# Patient Record
Sex: Female | Born: 1940 | Race: White | Hispanic: No | Marital: Married | Smoking: Never smoker
Health system: Southern US, Community
[De-identification: ages and names within clinical notes are randomized; demographics above are authoritative.]

## PROBLEM LIST (undated history)

## (undated) DIAGNOSIS — I2510 Atherosclerotic heart disease of native coronary artery without angina pectoris: Secondary | ICD-10-CM

## (undated) DIAGNOSIS — I214 Non-ST elevation (NSTEMI) myocardial infarction: Secondary | ICD-10-CM

## (undated) HISTORY — PX: COLONOSCOPY: SHX174

## (undated) HISTORY — PX: BYPASS GRAFT: SHX909

## (undated) HISTORY — PX: CARDIAC SURGERY: SHX584

## (undated) HISTORY — PX: ABDOMINAL SURGERY: SHX537

---
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
## 2018-12-18 ENCOUNTER — Other Ambulatory Visit: Payer: Self-pay

## 2018-12-18 ENCOUNTER — Encounter (HOSPITAL_COMMUNITY): Payer: Self-pay | Admitting: Emergency Medicine

## 2018-12-18 ENCOUNTER — Emergency Department (HOSPITAL_COMMUNITY): Payer: Medicare Other

## 2018-12-18 ENCOUNTER — Inpatient Hospital Stay (HOSPITAL_COMMUNITY)
Admission: EM | Admit: 2018-12-18 | Discharge: 2018-12-30 | DRG: 981 | Disposition: A | Discharge status: Skilled Nursing Facility | Payer: Medicare Other | Attending: Internal Medicine | Admitting: Internal Medicine

## 2018-12-18 DIAGNOSIS — S32000A Wedge compression fracture of unspecified lumbar vertebra, initial encounter for closed fracture: Secondary | ICD-10-CM | POA: Diagnosis present

## 2018-12-18 DIAGNOSIS — S34101A Unspecified injury to L1 level of lumbar spinal cord, initial encounter: Secondary | ICD-10-CM | POA: Diagnosis not present

## 2018-12-18 DIAGNOSIS — I2510 Atherosclerotic heart disease of native coronary artery without angina pectoris: Secondary | ICD-10-CM | POA: Diagnosis present

## 2018-12-18 DIAGNOSIS — Z1159 Encounter for screening for other viral diseases: Secondary | ICD-10-CM

## 2018-12-18 DIAGNOSIS — I952 Hypotension due to drugs: Secondary | ICD-10-CM | POA: Diagnosis not present

## 2018-12-18 DIAGNOSIS — S32019A Unspecified fracture of first lumbar vertebra, initial encounter for closed fracture: Secondary | ICD-10-CM | POA: Diagnosis present

## 2018-12-18 DIAGNOSIS — M545 Low back pain: Secondary | ICD-10-CM | POA: Diagnosis not present

## 2018-12-18 DIAGNOSIS — I252 Old myocardial infarction: Secondary | ICD-10-CM | POA: Diagnosis not present

## 2018-12-18 DIAGNOSIS — D62 Acute posthemorrhagic anemia: Secondary | ICD-10-CM | POA: Diagnosis not present

## 2018-12-18 DIAGNOSIS — N390 Urinary tract infection, site not specified: Secondary | ICD-10-CM | POA: Diagnosis not present

## 2018-12-18 DIAGNOSIS — G92 Toxic encephalopathy: Secondary | ICD-10-CM | POA: Diagnosis present

## 2018-12-18 DIAGNOSIS — K921 Melena: Secondary | ICD-10-CM | POA: Diagnosis not present

## 2018-12-18 DIAGNOSIS — I10 Essential (primary) hypertension: Secondary | ICD-10-CM | POA: Diagnosis not present

## 2018-12-18 DIAGNOSIS — K449 Diaphragmatic hernia without obstruction or gangrene: Secondary | ICD-10-CM | POA: Diagnosis not present

## 2018-12-18 DIAGNOSIS — S32012A Unstable burst fracture of first lumbar vertebra, initial encounter for closed fracture: Secondary | ICD-10-CM | POA: Diagnosis present

## 2018-12-18 DIAGNOSIS — Y92828 Other wilderness area as the place of occurrence of the external cause: Secondary | ICD-10-CM

## 2018-12-18 DIAGNOSIS — Z419 Encounter for procedure for purposes other than remedying health state, unspecified: Secondary | ICD-10-CM

## 2018-12-18 DIAGNOSIS — Z951 Presence of aortocoronary bypass graft: Secondary | ICD-10-CM | POA: Diagnosis not present

## 2018-12-18 DIAGNOSIS — R112 Nausea with vomiting, unspecified: Secondary | ICD-10-CM

## 2018-12-18 DIAGNOSIS — K317 Polyp of stomach and duodenum: Secondary | ICD-10-CM | POA: Diagnosis not present

## 2018-12-18 DIAGNOSIS — IMO0001 Reserved for inherently not codable concepts without codable children: Secondary | ICD-10-CM

## 2018-12-18 DIAGNOSIS — S32010A Wedge compression fracture of first lumbar vertebra, initial encounter for closed fracture: Secondary | ICD-10-CM

## 2018-12-18 DIAGNOSIS — S24104A Unspecified injury at T11-T12 level of thoracic spinal cord, initial encounter: Secondary | ICD-10-CM | POA: Diagnosis present

## 2018-12-18 DIAGNOSIS — T40605A Adverse effect of unspecified narcotics, initial encounter: Secondary | ICD-10-CM | POA: Diagnosis not present

## 2018-12-18 DIAGNOSIS — D72829 Elevated white blood cell count, unspecified: Secondary | ICD-10-CM | POA: Diagnosis present

## 2018-12-18 HISTORY — DX: Non-ST elevation (NSTEMI) myocardial infarction: I21.4

## 2018-12-18 LAB — BASIC METABOLIC PANEL
Anion gap: 10 (ref 5–15)
BUN: 23 mg/dL (ref 8–23)
CO2: 25 mmol/L (ref 22–32)
Calcium: 9.1 mg/dL (ref 8.9–10.3)
Chloride: 106 mmol/L (ref 98–111)
Creatinine, Ser: 1.04 mg/dL — ABNORMAL HIGH (ref 0.44–1.00)
GFR calc Af Amer: 60 mL/min — ABNORMAL LOW (ref >60)
GFR calc non Af Amer: 51 mL/min — ABNORMAL LOW (ref >60)
Glucose, Bld: 139 mg/dL — ABNORMAL HIGH (ref 70–99)
Potassium: 3.5 mmol/L (ref 3.5–5.1)
Sodium: 141 mmol/L (ref 135–145)

## 2018-12-18 LAB — CBC WITH DIFFERENTIAL/PLATELET
Abs Immature Granulocytes: 0.15 10*3/uL — ABNORMAL HIGH (ref 0.00–0.07)
Basophils Absolute: 0 10*3/uL (ref 0.0–0.1)
Basophils Relative: 0 %
Eosinophils Absolute: 0.1 10*3/uL (ref 0.0–0.5)
Eosinophils Relative: 0 %
HCT: 39.5 % (ref 36.0–46.0)
Hemoglobin: 12.9 g/dL (ref 12.0–15.0)
Immature Granulocytes: 1 %
Lymphocytes Relative: 5 %
Lymphs Abs: 0.9 10*3/uL (ref 0.7–4.0)
MCH: 29.3 pg (ref 26.0–34.0)
MCHC: 32.7 g/dL (ref 30.0–36.0)
MCV: 89.6 fL (ref 80.0–100.0)
Monocytes Absolute: 0.6 10*3/uL (ref 0.1–1.0)
Monocytes Relative: 3 %
Neutro Abs: 16 10*3/uL — ABNORMAL HIGH (ref 1.7–7.7)
Neutrophils Relative %: 91 %
Platelets: 230 10*3/uL (ref 150–400)
RBC: 4.41 MIL/uL (ref 3.87–5.11)
RDW: 13 % (ref 11.5–15.5)
WBC: 17.8 10*3/uL — ABNORMAL HIGH (ref 4.0–10.5)
nRBC: 0 % (ref 0.0–0.2)

## 2018-12-18 LAB — SARS CORONAVIRUS 2 BY RT PCR (HOSPITAL ORDER, PERFORMED IN ~~LOC~~ HOSPITAL LAB): SARS Coronavirus 2: NEGATIVE

## 2018-12-18 MED ORDER — MORPHINE SULFATE (PF) 4 MG/ML IV SOLN
4.0000 mg | Freq: Once | INTRAVENOUS | Status: AC
  Administered 2018-12-18: 18:00:00 4 mg via INTRAVENOUS
  Filled 2018-12-18: qty 1

## 2018-12-18 MED ORDER — MORPHINE SULFATE (PF) 4 MG/ML IV SOLN
4.0000 mg | Freq: Once | INTRAVENOUS | Status: AC
  Administered 2018-12-18: 4 mg via INTRAVENOUS
  Filled 2018-12-18: qty 1

## 2018-12-18 NOTE — ED Notes (Addendum)
Patient back from CT scan.
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
## 2018-12-18 NOTE — ED Notes (Signed)
Pt reports numbness to bilateral feet

## 2018-12-18 NOTE — ED Notes (Signed)
Patient arrived from [facility] ED, continues with pain all over body.  Decreased sensation on the left than right.  Color, mobility and temperature normal.
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
## 2018-12-19 ENCOUNTER — Emergency Department (HOSPITAL_COMMUNITY): Payer: Medicare Other

## 2018-12-19 ENCOUNTER — Encounter (HOSPITAL_COMMUNITY): Payer: Self-pay | Admitting: Family Medicine

## 2018-12-19 DIAGNOSIS — I2510 Atherosclerotic heart disease of native coronary artery without angina pectoris: Secondary | ICD-10-CM | POA: Diagnosis not present

## 2018-12-19 DIAGNOSIS — S32019A Unspecified fracture of first lumbar vertebra, initial encounter for closed fracture: Secondary | ICD-10-CM | POA: Diagnosis present

## 2018-12-19 DIAGNOSIS — D72829 Elevated white blood cell count, unspecified: Secondary | ICD-10-CM | POA: Diagnosis not present

## 2018-12-19 DIAGNOSIS — S32000A Wedge compression fracture of unspecified lumbar vertebra, initial encounter for closed fracture: Secondary | ICD-10-CM

## 2018-12-19 DIAGNOSIS — I10 Essential (primary) hypertension: Secondary | ICD-10-CM | POA: Diagnosis not present

## 2018-12-19 HISTORY — DX: Essential (primary) hypertension: I10

## 2018-12-19 HISTORY — DX: Atherosclerotic heart disease of native coronary artery without angina pectoris: I25.10

## 2018-12-19 LAB — SURGICAL PCR SCREEN
MRSA, PCR: NEGATIVE
Staphylococcus aureus: NEGATIVE

## 2018-12-19 LAB — GLUCOSE, CAPILLARY: Glucose-Capillary: 100 mg/dL — ABNORMAL HIGH (ref 70–99)

## 2018-12-19 MED ORDER — ONDANSETRON HCL 4 MG/2ML IJ SOLN
4.0000 mg | Freq: Four times a day (QID) | INTRAMUSCULAR | Status: DC | PRN
  Administered 2018-12-20 – 2018-12-29 (×4): 4 mg via INTRAVENOUS
  Filled 2018-12-19 (×5): qty 2

## 2018-12-19 MED ORDER — METHOCARBAMOL 500 MG PO TABS
500.0000 mg | ORAL_TABLET | Freq: Three times a day (TID) | ORAL | Status: DC | PRN
  Administered 2018-12-19 – 2018-12-30 (×15): 500 mg via ORAL
  Filled 2018-12-19 (×17): qty 1

## 2018-12-19 MED ORDER — PANTOPRAZOLE SODIUM 40 MG PO TBEC
40.0000 mg | DELAYED_RELEASE_TABLET | Freq: Every day | ORAL | Status: DC
  Administered 2018-12-19 – 2018-12-23 (×4): 40 mg via ORAL
  Filled 2018-12-19 (×4): qty 1

## 2018-12-19 MED ORDER — ACETAMINOPHEN 650 MG RE SUPP: 650.0000 mg | Freq: Four times a day (QID) | RECTAL | Status: DC | PRN

## 2018-12-19 MED ORDER — ONDANSETRON HCL 4 MG/2ML IJ SOLN
4.0000 mg | Freq: Once | INTRAMUSCULAR | Status: AC
  Administered 2018-12-19: 4 mg via INTRAVENOUS

## 2018-12-19 MED ORDER — ONDANSETRON HCL 4 MG/2ML IJ SOLN: 4.0000 mg | Freq: Once | INTRAMUSCULAR | Status: DC

## 2018-12-19 MED ORDER — POLYETHYLENE GLYCOL 3350 17 G PO PACK: 17.0000 g | PACK | Freq: Every day | ORAL | Status: DC | PRN

## 2018-12-19 MED ORDER — HYDROCODONE-ACETAMINOPHEN 5-325 MG PO TABS
1.0000 | ORAL_TABLET | ORAL | Status: DC | PRN
  Administered 2018-12-19: 1 via ORAL
  Filled 2018-12-19: qty 1

## 2018-12-19 MED ORDER — MORPHINE SULFATE (PF) 4 MG/ML IV SOLN
4.0000 mg | INTRAVENOUS | Status: DC | PRN
  Administered 2018-12-19 – 2018-12-22 (×5): 4 mg via INTRAVENOUS
  Filled 2018-12-19 (×5): qty 1

## 2018-12-19 MED ORDER — ONDANSETRON HCL 4 MG/2ML IJ SOLN
INTRAMUSCULAR | Status: AC
  Filled 2018-12-19: qty 2

## 2018-12-19 MED ORDER — POTASSIUM CHLORIDE IN NACL 20-0.9 MEQ/L-% IV SOLN
INTRAVENOUS | Status: AC
  Administered 2018-12-19: 07:00:00 via INTRAVENOUS
  Filled 2018-12-19 (×3): qty 1000

## 2018-12-19 MED ORDER — ONDANSETRON HCL 4 MG PO TABS
4.0000 mg | ORAL_TABLET | Freq: Four times a day (QID) | ORAL | Status: DC | PRN
  Administered 2018-12-19 – 2018-12-30 (×2): 4 mg via ORAL
  Filled 2018-12-19 (×2): qty 1

## 2018-12-19 MED ORDER — OXYCODONE-ACETAMINOPHEN 5-325 MG PO TABS
2.0000 | ORAL_TABLET | Freq: Once | ORAL | Status: AC
  Administered 2018-12-19: 01:00:00 2 via ORAL
  Filled 2018-12-19: qty 2

## 2018-12-19 MED ORDER — METOPROLOL TARTRATE 25 MG PO TABS
25.0000 mg | ORAL_TABLET | Freq: Every day | ORAL | Status: DC
  Administered 2018-12-19 – 2018-12-30 (×10): 25 mg via ORAL
  Filled 2018-12-19 (×11): qty 1

## 2018-12-19 MED ORDER — METHOCARBAMOL 500 MG PO TABS
500.0000 mg | ORAL_TABLET | Freq: Once | ORAL | Status: AC
  Administered 2018-12-19: 500 mg via ORAL
  Filled 2018-12-19: qty 1

## 2018-12-19 MED ORDER — PRAVASTATIN SODIUM 40 MG PO TABS
40.0000 mg | ORAL_TABLET | Freq: Every day | ORAL | Status: DC
  Administered 2018-12-19 – 2018-12-29 (×11): 40 mg via ORAL
  Filled 2018-12-19 (×11): qty 1

## 2018-12-19 MED ORDER — OXYCODONE HCL 5 MG PO TABS
5.0000 mg | ORAL_TABLET | ORAL | Status: DC | PRN
  Administered 2018-12-19 – 2018-12-23 (×11): 5 mg via ORAL
  Filled 2018-12-19 (×12): qty 1

## 2018-12-19 MED ORDER — ACETAMINOPHEN 325 MG PO TABS: 650.0000 mg | ORAL_TABLET | Freq: Four times a day (QID) | ORAL | Status: DC | PRN

## 2018-12-19 MED ORDER — ACETAMINOPHEN 500 MG PO TABS
1000.0000 mg | ORAL_TABLET | Freq: Three times a day (TID) | ORAL | Status: AC
  Administered 2018-12-19 – 2018-12-22 (×9): 1000 mg via ORAL
  Filled 2018-12-19 (×9): qty 2

## 2018-12-19 NOTE — ED Notes (Signed)
Son is Patricia Malone, cell phone [phone].  He would like to speak with neurosurgeon that is going to see his mother.    Son has been updated on care and floor of admission.

## 2018-12-19 NOTE — Anesthesia Preprocedure Evaluation (Addendum)
Anesthesia Evaluation  Patient identified by MRN, date of birth, ID band Patient awake    Reviewed: Allergy & Precautions, NPO status , Patient's Chart, lab work & pertinent test results, reviewed documented beta blocker date and time   History of Anesthesia Complications Negative for: history of anesthetic complications  Airway Mallampati: II  TM Distance: >3 FB Neck ROM: Full    Dental  (+) Teeth Intact, Dental Advidsory Given   Pulmonary neg pulmonary ROS,    Pulmonary exam normal        Cardiovascular hypertension, Pt. on home beta blockers and Pt. on medications + CAD, + Past MI and + CABG  Normal cardiovascular exam     Neuro/Psych L1 compression fracture    GI/Hepatic Neg liver ROS, GERD  Medicated and Controlled,  Endo/Other  negative endocrine ROS  Renal/GU negative Renal ROS     Musculoskeletal negative musculoskeletal ROS (+)   Abdominal   Peds  Hematology negative hematology ROS (+)   Anesthesia Other Findings Day of surgery medications reviewed with the patient.  Reproductive/Obstetrics                          Anesthesia Physical Anesthesia Plan  ASA: III  Anesthesia Plan: General   Post-op Pain Management:    Induction: Intravenous  PONV Risk Score and Plan: 4 or greater and Treatment may vary due to age or medical condition, Ondansetron and Dexamethasone  Airway Management Planned: Oral ETT  Additional Equipment:   Intra-op Plan:   Post-operative Plan: Extubation in OR  Informed Consent: I have reviewed the patients History and Physical, chart, labs and discussed the procedure including the risks, benefits and alternatives for the proposed anesthesia with the patient or authorized representative who has indicated his/her understanding and acceptance.     Dental advisory given  Plan Discussed with: CRNA  Anesthesia Plan Comments:       Anesthesia  Quick Evaluation

## 2018-12-19 NOTE — ED Provider Notes (Signed)
12:14 AM Assumed care from Dr. Jacqulyn Bath, please see their note for full history, physical and decision making until this point. In brief this is a 78 y.o. year old female who presented to the ED tonight with Back Pain     Transfer from [facility] secondary to compression fracture in her back with associated paresthesias bilateral lower extremities.  Had already discussed with neurosurgery who recommends MRI to evaluate for any neurologic compromise.  We will continue to work on pain control here. On my evaluation pain seems to be relatively improved but she does have bilateral paresthesias to the lateral lower legs.  Motor is intact.  Decreased sensation in the same areas. MRI pending.   Mri as above. Discussed with Dr. Johnsie Cancel who stated no indication for emergent surgery but will help manage if admitted for pain control.   Patient still in severe pain, barely able to move in bed. From out of town. No support at home. Not able to get pain controlled, will d/w medicine.   Labs, studies and imaging reviewed by myself and considered in medical decision making if ordered. Imaging interpreted by radiology.  Labs Reviewed  BASIC METABOLIC PANEL - Abnormal; Notable for the following components:      Result Value   Glucose, Bld 139 (*)    Creatinine, Ser 1.04 (*)    GFR calc non Af Amer 51 (*)    GFR calc Af Amer 60 (*)    All other components within normal limits  CBC WITH DIFFERENTIAL/PLATELET - Abnormal; Notable for the following components:   WBC 17.8 (*)    Neutro Abs 16.0 (*)    Abs Immature Granulocytes 0.15 (*)    All other components within normal limits  SARS CORONAVIRUS 2 (HOSPITAL ORDER, PERFORMED IN [facility])    CT Thoracic Spine Wo Contrast  Final Result    CT Lumbar Spine Wo Contrast  Final Result    MR LUMBAR SPINE WO CONTRAST    (Results Pending)    No follow-ups on file.    Oluwatomisin Hustead, Barbara Cower, MD 12/21/18 [phone]

## 2018-12-19 NOTE — Plan of Care (Signed)

## 2018-12-19 NOTE — ED Notes (Signed)
Returned from MRI 
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
## 2018-12-19 NOTE — ED Notes (Signed)
Patient with muscle spasm in her back.  She states that she broke out into a cold sweat and then became nauseated.  MD notified.  New orders per MD.
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
## 2018-12-19 NOTE — ED Notes (Signed)
Patient transported to MRI 
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
## 2018-12-20 ENCOUNTER — Observation Stay (HOSPITAL_COMMUNITY): Payer: Medicare Other

## 2018-12-20 ENCOUNTER — Observation Stay (HOSPITAL_COMMUNITY): Payer: Medicare Other | Admitting: Anesthesiology

## 2018-12-20 ENCOUNTER — Encounter (HOSPITAL_COMMUNITY)
Admission: EM | Disposition: A | Discharge status: Skilled Nursing Facility | Payer: Self-pay | Source: Home / Self Care | Attending: Family Medicine

## 2018-12-20 DIAGNOSIS — D62 Acute posthemorrhagic anemia: Secondary | ICD-10-CM | POA: Diagnosis not present

## 2018-12-20 DIAGNOSIS — I252 Old myocardial infarction: Secondary | ICD-10-CM | POA: Diagnosis not present

## 2018-12-20 DIAGNOSIS — D72823 Leukemoid reaction: Secondary | ICD-10-CM | POA: Diagnosis not present

## 2018-12-20 DIAGNOSIS — G92 Toxic encephalopathy: Secondary | ICD-10-CM | POA: Diagnosis present

## 2018-12-20 DIAGNOSIS — Z1159 Encounter for screening for other viral diseases: Secondary | ICD-10-CM | POA: Diagnosis not present

## 2018-12-20 DIAGNOSIS — I2510 Atherosclerotic heart disease of native coronary artery without angina pectoris: Secondary | ICD-10-CM | POA: Diagnosis present

## 2018-12-20 DIAGNOSIS — N390 Urinary tract infection, site not specified: Secondary | ICD-10-CM | POA: Diagnosis present

## 2018-12-20 DIAGNOSIS — K921 Melena: Secondary | ICD-10-CM | POA: Diagnosis not present

## 2018-12-20 DIAGNOSIS — S32010D Wedge compression fracture of first lumbar vertebra, subsequent encounter for fracture with routine healing: Secondary | ICD-10-CM | POA: Diagnosis not present

## 2018-12-20 DIAGNOSIS — T40605A Adverse effect of unspecified narcotics, initial encounter: Secondary | ICD-10-CM | POA: Diagnosis not present

## 2018-12-20 DIAGNOSIS — K2901 Acute gastritis with bleeding: Secondary | ICD-10-CM | POA: Diagnosis not present

## 2018-12-20 DIAGNOSIS — I10 Essential (primary) hypertension: Secondary | ICD-10-CM | POA: Diagnosis present

## 2018-12-20 DIAGNOSIS — K449 Diaphragmatic hernia without obstruction or gangrene: Secondary | ICD-10-CM | POA: Diagnosis present

## 2018-12-20 DIAGNOSIS — S32000A Wedge compression fracture of unspecified lumbar vertebra, initial encounter for closed fracture: Secondary | ICD-10-CM | POA: Diagnosis not present

## 2018-12-20 DIAGNOSIS — S34101A Unspecified injury to L1 level of lumbar spinal cord, initial encounter: Secondary | ICD-10-CM | POA: Diagnosis present

## 2018-12-20 DIAGNOSIS — M545 Low back pain: Secondary | ICD-10-CM | POA: Diagnosis present

## 2018-12-20 DIAGNOSIS — S24104A Unspecified injury at T11-T12 level of thoracic spinal cord, initial encounter: Secondary | ICD-10-CM | POA: Diagnosis present

## 2018-12-20 DIAGNOSIS — Y92828 Other wilderness area as the place of occurrence of the external cause: Secondary | ICD-10-CM | POA: Diagnosis not present

## 2018-12-20 DIAGNOSIS — Z951 Presence of aortocoronary bypass graft: Secondary | ICD-10-CM | POA: Diagnosis not present

## 2018-12-20 DIAGNOSIS — I952 Hypotension due to drugs: Secondary | ICD-10-CM | POA: Diagnosis not present

## 2018-12-20 DIAGNOSIS — S32010A Wedge compression fracture of first lumbar vertebra, initial encounter for closed fracture: Secondary | ICD-10-CM | POA: Diagnosis not present

## 2018-12-20 DIAGNOSIS — S32012A Unstable burst fracture of first lumbar vertebra, initial encounter for closed fracture: Secondary | ICD-10-CM | POA: Diagnosis present

## 2018-12-20 DIAGNOSIS — K317 Polyp of stomach and duodenum: Secondary | ICD-10-CM | POA: Diagnosis present

## 2018-12-20 HISTORY — PX: LUMBAR PERCUTANEOUS PEDICLE SCREW 2 LEVEL: SHX5561

## 2018-12-20 LAB — CBC WITH DIFFERENTIAL/PLATELET
Abs Immature Granulocytes: 0.03 10*3/uL (ref 0.00–0.07)
Basophils Absolute: 0 10*3/uL (ref 0.0–0.1)
Basophils Relative: 0 %
Eosinophils Absolute: 0.3 10*3/uL (ref 0.0–0.5)
Eosinophils Relative: 2 %
HCT: 36.5 % (ref 36.0–46.0)
Hemoglobin: 11.8 g/dL — ABNORMAL LOW (ref 12.0–15.0)
Immature Granulocytes: 0 %
Lymphocytes Relative: 14 %
Lymphs Abs: 1.6 10*3/uL (ref 0.7–4.0)
MCH: 29.1 pg (ref 26.0–34.0)
MCHC: 32.3 g/dL (ref 30.0–36.0)
MCV: 89.9 fL (ref 80.0–100.0)
Monocytes Absolute: 0.7 10*3/uL (ref 0.1–1.0)
Monocytes Relative: 6 %
Neutro Abs: 9.1 10*3/uL — ABNORMAL HIGH (ref 1.7–7.7)
Neutrophils Relative %: 78 %
Platelets: 183 10*3/uL (ref 150–400)
RBC: 4.06 MIL/uL (ref 3.87–5.11)
RDW: 13 % (ref 11.5–15.5)
WBC: 11.7 10*3/uL — ABNORMAL HIGH (ref 4.0–10.5)
nRBC: 0 % (ref 0.0–0.2)

## 2018-12-20 LAB — BASIC METABOLIC PANEL
Anion gap: 6 (ref 5–15)
BUN: 16 mg/dL (ref 8–23)
CO2: 27 mmol/L (ref 22–32)
Calcium: 8.5 mg/dL — ABNORMAL LOW (ref 8.9–10.3)
Chloride: 107 mmol/L (ref 98–111)
Creatinine, Ser: 0.94 mg/dL (ref 0.44–1.00)
GFR calc Af Amer: >60 mL/min (ref >60)
GFR calc non Af Amer: 58 mL/min — ABNORMAL LOW (ref >60)
Glucose, Bld: 99 mg/dL (ref 70–99)
Potassium: 3.5 mmol/L (ref 3.5–5.1)
Sodium: 140 mmol/L (ref 135–145)

## 2018-12-20 LAB — MAGNESIUM: Magnesium: 1.7 mg/dL (ref 1.7–2.4)

## 2018-12-20 SURGERY — LUMBAR PERCUTANEOUS PEDICLE SCREW 2 LEVEL
Anesthesia: General

## 2018-12-20 MED ORDER — PROPOFOL 10 MG/ML IV BOLUS
INTRAVENOUS | Status: AC
  Filled 2018-12-20: qty 20

## 2018-12-20 MED ORDER — DEXAMETHASONE SODIUM PHOSPHATE 10 MG/ML IJ SOLN
INTRAMUSCULAR | Status: DC | PRN
  Administered 2018-12-20: 10 mg via INTRAVENOUS

## 2018-12-20 MED ORDER — SODIUM CHLORIDE 0.9 % IV SOLN
INTRAVENOUS | Status: DC | PRN
  Administered 2018-12-20: 07:00:00

## 2018-12-20 MED ORDER — SODIUM CHLORIDE 0.9 % IV SOLN
INTRAVENOUS | Status: DC | PRN
  Administered 2018-12-20: 50 ug/min via INTRAVENOUS

## 2018-12-20 MED ORDER — OXYCODONE HCL 5 MG PO TABS: 5.0000 mg | ORAL_TABLET | Freq: Once | ORAL | Status: DC | PRN

## 2018-12-20 MED ORDER — THROMBIN 5000 UNITS EX SOLR
OROMUCOSAL | Status: DC | PRN
  Administered 2018-12-20: 07:00:00 via TOPICAL

## 2018-12-20 MED ORDER — SUGAMMADEX SODIUM 200 MG/2ML IV SOLN
INTRAVENOUS | Status: DC | PRN
  Administered 2018-12-20: 200 mg via INTRAVENOUS

## 2018-12-20 MED ORDER — CEFAZOLIN SODIUM-DEXTROSE 2-3 GM-%(50ML) IV SOLR
INTRAVENOUS | Status: DC | PRN
  Administered 2018-12-20: 2 g via INTRAVENOUS

## 2018-12-20 MED ORDER — THROMBIN 5000 UNITS EX SOLR
CUTANEOUS | Status: AC
  Filled 2018-12-20: qty 5000

## 2018-12-20 MED ORDER — ARTIFICIAL TEARS OPHTHALMIC OINT
TOPICAL_OINTMENT | OPHTHALMIC | Status: DC | PRN
  Administered 2018-12-20: 1 via OPHTHALMIC

## 2018-12-20 MED ORDER — HYDROMORPHONE HCL 1 MG/ML IJ SOLN
INTRAMUSCULAR | Status: AC
  Filled 2018-12-20: qty 1

## 2018-12-20 MED ORDER — ROCURONIUM BROMIDE 50 MG/5ML IV SOSY
PREFILLED_SYRINGE | INTRAVENOUS | Status: DC | PRN
  Administered 2018-12-20: 50 mg via INTRAVENOUS
  Administered 2018-12-20 (×2): 10 mg via INTRAVENOUS

## 2018-12-20 MED ORDER — OXYCODONE HCL 5 MG/5ML PO SOLN: 5.0000 mg | Freq: Once | ORAL | Status: DC | PRN

## 2018-12-20 MED ORDER — ONDANSETRON HCL 4 MG/2ML IJ SOLN
INTRAMUSCULAR | Status: DC | PRN
  Administered 2018-12-20: 4 mg via INTRAVENOUS

## 2018-12-20 MED ORDER — ACETAMINOPHEN 10 MG/ML IV SOLN: 1000.0000 mg | Freq: Once | INTRAVENOUS | Status: DC | PRN

## 2018-12-20 MED ORDER — BUPIVACAINE HCL (PF) 0.5 % IJ SOLN
INTRAMUSCULAR | Status: DC | PRN
  Administered 2018-12-20: 10 mL

## 2018-12-20 MED ORDER — PROMETHAZINE HCL 25 MG/ML IJ SOLN: 6.2500 mg | INTRAMUSCULAR | Status: DC | PRN

## 2018-12-20 MED ORDER — HYDROMORPHONE HCL 1 MG/ML IJ SOLN
0.2500 mg | INTRAMUSCULAR | Status: DC | PRN
  Administered 2018-12-20 (×2): 0.5 mg via INTRAVENOUS

## 2018-12-20 MED ORDER — PROPOFOL 10 MG/ML IV BOLUS
INTRAVENOUS | Status: DC | PRN
  Administered 2018-12-20: 150 mg via INTRAVENOUS

## 2018-12-20 MED ORDER — 0.9 % SODIUM CHLORIDE (POUR BTL) OPTIME
TOPICAL | Status: DC | PRN
  Administered 2018-12-20: 1000 mL

## 2018-12-20 MED ORDER — BUPIVACAINE HCL (PF) 0.5 % IJ SOLN
INTRAMUSCULAR | Status: AC
  Filled 2018-12-20: qty 30

## 2018-12-20 MED ORDER — FENTANYL CITRATE (PF) 250 MCG/5ML IJ SOLN
INTRAMUSCULAR | Status: AC
  Filled 2018-12-20: qty 5

## 2018-12-20 MED ORDER — LIDOCAINE 2% (20 MG/ML) 5 ML SYRINGE
INTRAMUSCULAR | Status: DC | PRN
  Administered 2018-12-20: 80 mg via INTRAVENOUS

## 2018-12-20 MED ORDER — CEFAZOLIN SODIUM-DEXTROSE 2-4 GM/100ML-% IV SOLN
INTRAVENOUS | Status: AC
  Filled 2018-12-20: qty 100

## 2018-12-20 MED ORDER — LIDOCAINE-EPINEPHRINE 1 %-1:100000 IJ SOLN
INTRAMUSCULAR | Status: DC | PRN
  Administered 2018-12-20: 10 mL

## 2018-12-20 MED ORDER — LIDOCAINE-EPINEPHRINE 1 %-1:100000 IJ SOLN
INTRAMUSCULAR | Status: AC
  Filled 2018-12-20: qty 1

## 2018-12-20 MED ORDER — FENTANYL CITRATE (PF) 100 MCG/2ML IJ SOLN
INTRAMUSCULAR | Status: DC | PRN
  Administered 2018-12-20 (×3): 50 ug via INTRAVENOUS
  Administered 2018-12-20 (×2): 25 ug via INTRAVENOUS

## 2018-12-20 MED ORDER — LACTATED RINGERS IV SOLN
INTRAVENOUS | Status: DC | PRN
  Administered 2018-12-20 (×2): via INTRAVENOUS

## 2018-12-20 SURGICAL SUPPLY — 68 items
BAG DECANTER FOR FLEXI CONT (MISCELLANEOUS) ×3 IMPLANT
BASKET BONE COLLECTION (BASKET) ×3 IMPLANT
BENZOIN TINCTURE PRP APPL 2/3 (GAUZE/BANDAGES/DRESSINGS) IMPLANT
BLADE CLIPPER SURG (BLADE) IMPLANT
BLADE SURG 11 STRL SS (BLADE) ×3 IMPLANT
BUR MATCHSTICK NEURO 3.0 LAGG (BURR) ×3 IMPLANT
BUR PRECISION FLUTE 5.0 (BURR) ×3 IMPLANT
CANISTER SUCT 3000ML PPV (MISCELLANEOUS) ×3 IMPLANT
CLOSURE WOUND 1/2 X4 (GAUZE/BANDAGES/DRESSINGS)
CONT SPEC 4OZ CLIKSEAL STRL BL (MISCELLANEOUS) ×3 IMPLANT
COVER BACK TABLE 60X90IN (DRAPES) ×3 IMPLANT
COVER WAND RF STERILE (DRAPES) ×3 IMPLANT
DECANTER SPIKE VIAL GLASS SM (MISCELLANEOUS) ×3 IMPLANT
DERMABOND ADVANCED (GAUZE/BANDAGES/DRESSINGS) ×4
DERMABOND ADVANCED .7 DNX12 (GAUZE/BANDAGES/DRESSINGS) ×1 IMPLANT
DRAPE C-ARM 42X72 X-RAY (DRAPES) ×2 IMPLANT
DRAPE C-ARMOR (DRAPES) ×2 IMPLANT
DRAPE LAPAROTOMY 100X72X124 (DRAPES) ×3 IMPLANT
DRAPE SURG 17X23 STRL (DRAPES) ×3 IMPLANT
DURAPREP 26ML APPLICATOR (WOUND CARE) ×3 IMPLANT
ELECT REM PT RETURN 9FT ADLT (ELECTROSURGICAL) ×3
ELECTRODE REM PT RTRN 9FT ADLT (ELECTROSURGICAL) ×1 IMPLANT
GAUZE 4X4 16PLY RFD (DISPOSABLE) IMPLANT
GAUZE SPONGE 4X4 12PLY STRL (GAUZE/BANDAGES/DRESSINGS) IMPLANT
GLOVE BIO SURGEON STRL SZ7.5 (GLOVE) ×6 IMPLANT
GLOVE BIOGEL PI IND STRL 7.5 (GLOVE) ×2 IMPLANT
GLOVE BIOGEL PI INDICATOR 7.5 (GLOVE) ×4
GLOVE EXAM NITRILE LRG STRL (GLOVE) IMPLANT
GLOVE EXAM NITRILE XL STR (GLOVE) IMPLANT
GLOVE EXAM NITRILE XS STR PU (GLOVE) IMPLANT
GOWN STRL REUS W/ TWL LRG LVL3 (GOWN DISPOSABLE) ×4 IMPLANT
GOWN STRL REUS W/ TWL XL LVL3 (GOWN DISPOSABLE) IMPLANT
GOWN STRL REUS W/TWL 2XL LVL3 (GOWN DISPOSABLE) IMPLANT
GOWN STRL REUS W/TWL LRG LVL3 (GOWN DISPOSABLE) ×4
GOWN STRL REUS W/TWL XL LVL3 (GOWN DISPOSABLE) ×2
GUIDEWIRE 18IN BLUNT CD HORIZ (WIRE) ×12 IMPLANT
HEMOSTAT POWDER KIT SURGIFOAM (HEMOSTASIS) ×3 IMPLANT
KIT BASIN OR (CUSTOM PROCEDURE TRAY) ×3 IMPLANT
KIT POSITION SURG JACKSON T1 (MISCELLANEOUS) ×3 IMPLANT
KIT TURNOVER KIT B (KITS) ×3 IMPLANT
MILL MEDIUM DISP (BLADE) ×3 IMPLANT
NDL BEVEL TWO-PAK W/1PK (NEEDLE) IMPLANT
NDL HYPO 18GX1.5 BLUNT FILL (NEEDLE) IMPLANT
NDL SPNL 18GX3.5 QUINCKE PK (NEEDLE) IMPLANT
NEEDLE BEVEL TWO-PAK W/1PK (NEEDLE) ×3 IMPLANT
NEEDLE HYPO 18GX1.5 BLUNT FILL (NEEDLE) IMPLANT
NEEDLE HYPO 22GX1.5 SAFETY (NEEDLE) ×3 IMPLANT
NEEDLE SPNL 18GX3.5 QUINCKE PK (NEEDLE) IMPLANT
NS IRRIG 1000ML POUR BTL (IV SOLUTION) ×3 IMPLANT
PACK LAMINECTOMY NEURO (CUSTOM PROCEDURE TRAY) ×3 IMPLANT
PAD ARMBOARD 7.5X6 YLW CONV (MISCELLANEOUS) ×9 IMPLANT
ROD PERC TI STRT 5.5X80 (Rod) ×4 IMPLANT
SCREW 5.5X40MM SOLERA VOYAGER (Screw) ×8 IMPLANT
SCREW MAS VOYAGER 4.5X40 (Screw) ×4 IMPLANT
SCREW SET 5.5/6.0MM SOLERA (Screw) ×12 IMPLANT
SPONGE LAP 4X18 RFD (DISPOSABLE) IMPLANT
SPONGE SURGIFOAM ABS GEL 100 (HEMOSTASIS) IMPLANT
STRIP CLOSURE SKIN 1/2X4 (GAUZE/BANDAGES/DRESSINGS) IMPLANT
SUT MNCRL AB 3-0 PS2 18 (SUTURE) ×3 IMPLANT
SUT MNCRL AB 4-0 PS2 18 (SUTURE) ×4 IMPLANT
SUT VIC AB 0 CT1 18XCR BRD8 (SUTURE) ×1 IMPLANT
SUT VIC AB 0 CT1 8-18 (SUTURE) ×2
SUT VIC AB 2-0 CP2 18 (SUTURE) ×3 IMPLANT
SYR 3ML LL SCALE MARK (SYRINGE) IMPLANT
TOWEL GREEN STERILE (TOWEL DISPOSABLE) ×3 IMPLANT
TOWEL GREEN STERILE FF (TOWEL DISPOSABLE) ×3 IMPLANT
TRAY FOLEY MTR SLVR 16FR STAT (SET/KITS/TRAYS/PACK) ×3 IMPLANT
WATER STERILE IRR 1000ML POUR (IV SOLUTION) ×3 IMPLANT
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
## 2018-12-20 NOTE — Progress Notes (Addendum)
Neurosurgery Service Post-operative progress note  Assessment & Plan: 78 y.o. woman s/p perctuaneous T12-L2 pedicle screw placement. Seen in PACU, still recovering from anesthesia but MAEx4 with good strength and trying to get out of bed, pain appears significantly improved from preop.   -post-op, activity as tolerated, no restrictions, no brace needed -will have PT/OT evaluate to determine if pt needs rehab before being discharged -hold DVT chemoPPx for 24 hours -my partners will be covering this weekend. [place] be home before then. I discussed this with the patient and her family preop and put follow up / wound care instructions in her discharge instructions in Epic.   Patricia Malone  12/20/18 10:48 AM

## 2018-12-20 NOTE — Brief Op Note (Signed)
12/20/2018  10:47 AM  PATIENT:  Patricia Malone  78 y.o. female  PRE-OPERATIVE DIAGNOSIS:  Unstable L1 burst fracture  POST-OPERATIVE DIAGNOSIS:  Unstable L1 burst fracture  PROCEDURE:  Procedure(s): Thoracic twelve-Lumbar two PERCUTANEOUS PEDICLE SCREW PLACEMENT (N/A)  SURGEON:  Surgeon(s) and Role:    * Saksham Akkerman, Clovis Pu, MD - Primary  ASSISTANTS: none   ANESTHESIA:   general  EBL:  20 mL   BLOOD ADMINISTERED:none  DRAINS: none   LOCAL MEDICATIONS USED:  LIDOCAINE   SPECIMEN:  No Specimen  DISPOSITION OF SPECIMEN:  N/A  COUNTS:  YES  TOURNIQUET:  * No tourniquets in log *  DICTATION: .Note written in EPIC  PLAN OF CARE: Admit to inpatient   PATIENT DISPOSITION:  PACU - hemodynamically stable.   Delay start of Pharmacological VTE agent (>24hrs) due to surgical blood loss or risk of bleeding: yes

## 2018-12-20 NOTE — Anesthesia Postprocedure Evaluation (Signed)
Anesthesia Post Note  Patient: Patricia Malone  Procedure(s) Performed: Thoracic twelve-Lumbar two PERCUTANEOUS PEDICLE SCREW PLACEMENT (N/A )     Patient location during evaluation: PACU Anesthesia Type: General Level of consciousness: awake and alert Pain management: pain level controlled Vital Signs Assessment: post-procedure vital signs reviewed and stable Respiratory status: spontaneous breathing, nonlabored ventilation and respiratory function stable Cardiovascular status: blood pressure returned to baseline and stable Postop Assessment: no apparent nausea or vomiting Anesthetic complications: no    Last Vitals:  Vitals:   12/20/18 1141 12/20/18 1155  BP: (!) 139/54 121/85  Pulse: 73 67  Resp: 14 15  Temp: 36.5 C 36.6 C  SpO2: 98% 98%    Last Pain:  Vitals:   12/20/18 1141  TempSrc:   PainSc: 3                  Lowella Curb

## 2018-12-20 NOTE — Progress Notes (Signed)
Pt has both hearing aids in room confirmed with oncoming night nurse.

## 2018-12-20 NOTE — Progress Notes (Signed)
Contacted the ED and 5N looking for patients belongings.  Belongings did not come with patient to Short Stay before surgery.  Patient stated that her Cell phone was last seen in the ED.  Requested for 5N and ED to check for the patients belongings.  Patient has a bed on 5N after surgery.
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
## 2018-12-20 NOTE — Progress Notes (Signed)
Spoke with Jeannett Senior (patient's son) this morning to inform him of his mother transporting to the OR at [phone].  Her son's number is [phone].  Please contact him when patient returns from the OR.  Will have day shift staff to follow up.

## 2018-12-20 NOTE — Transfer of Care (Signed)
Immediate Anesthesia Transfer of Care Note  Patient: Patricia Malone  Procedure(s) Performed: Thoracic twelve-Lumbar two PERCUTANEOUS PEDICLE SCREW PLACEMENT (N/A )  Patient Location: PACU  Anesthesia Type:General  Level of Consciousness: sedated  Airway & Oxygen Therapy: Patient Spontanous Breathing and Patient connected to face mask oxygen  Post-op Assessment: Report given to RN, Post -op Vital signs reviewed and stable and Patient moving all extremities X 4  Post vital signs: Reviewed and stable  Last Vitals:  Vitals Value Taken Time  BP 167/67 12/20/2018 10:26 AM  Temp    Pulse 85 12/20/2018 10:27 AM  Resp 18 12/20/2018 10:27 AM  SpO2 100 % 12/20/2018 10:27 AM  Vitals shown include unvalidated device data.  Last Pain:  Vitals:   12/20/18 0515  TempSrc:   PainSc: 10-Worst pain ever      Patients Stated Pain Goal: 0 (12/20/18 0515)  Complications: No apparent anesthesia complications

## 2018-12-20 NOTE — Anesthesia Procedure Notes (Signed)
Procedure Name: Intubation Date/Time: 12/20/2018 7:42 AM Performed by: Neldon Newport, CRNA Pre-anesthesia Checklist: Timeout performed, Patient being monitored, Suction available, Emergency Drugs available and Patient identified Patient Re-evaluated:Patient Re-evaluated prior to induction Oxygen Delivery Method: Circle system utilized Preoxygenation: Pre-oxygenation with 100% oxygen Induction Type: IV induction Ventilation: Mask ventilation without difficulty Laryngoscope Size: Mac and 3 Grade View: Grade II Tube type: Oral Tube size: 7.0 mm Number of attempts: 1 Placement Confirmation: breath sounds checked- equal and bilateral,  positive ETCO2 and ETT inserted through vocal cords under direct vision Tube secured with: Tape Dental Injury: Teeth and Oropharynx as per pre-operative assessment  Comments: Anterior anatomy

## 2018-12-21 LAB — CBC
HCT: 34.7 % — ABNORMAL LOW (ref 36.0–46.0)
Hemoglobin: 11.4 g/dL — ABNORMAL LOW (ref 12.0–15.0)
MCH: 28.6 pg (ref 26.0–34.0)
MCHC: 32.9 g/dL (ref 30.0–36.0)
MCV: 87.2 fL (ref 80.0–100.0)
Platelets: 172 10*3/uL (ref 150–400)
RBC: 3.98 MIL/uL (ref 3.87–5.11)
RDW: 12.4 % (ref 11.5–15.5)
WBC: 16.4 10*3/uL — ABNORMAL HIGH (ref 4.0–10.5)
nRBC: 0 % (ref 0.0–0.2)

## 2018-12-21 LAB — COMPREHENSIVE METABOLIC PANEL
ALT: 18 U/L (ref 0–44)
AST: 34 U/L (ref 15–41)
Albumin: 3 g/dL — ABNORMAL LOW (ref 3.5–5.0)
Alkaline Phosphatase: 48 U/L (ref 38–126)
Anion gap: 10 (ref 5–15)
BUN: 17 mg/dL (ref 8–23)
CO2: 25 mmol/L (ref 22–32)
Calcium: 8.4 mg/dL — ABNORMAL LOW (ref 8.9–10.3)
Chloride: 103 mmol/L (ref 98–111)
Creatinine, Ser: 0.85 mg/dL (ref 0.44–1.00)
GFR calc Af Amer: >60 mL/min (ref >60)
GFR calc non Af Amer: >60 mL/min (ref >60)
Glucose, Bld: 117 mg/dL — ABNORMAL HIGH (ref 70–99)
Potassium: 3.5 mmol/L (ref 3.5–5.1)
Sodium: 138 mmol/L (ref 135–145)
Total Bilirubin: 1.1 mg/dL (ref 0.3–1.2)
Total Protein: 5.5 g/dL — ABNORMAL LOW (ref 6.5–8.1)

## 2018-12-21 LAB — GLUCOSE, CAPILLARY: Glucose-Capillary: 85 mg/dL (ref 70–99)

## 2018-12-21 NOTE — Progress Notes (Signed)
Orthopedic Tech Progress Note Patient Details:  Patricia Malone 1940-09-03 [id number] Called [employer] for tlso brace. Patient ID: Patricia Malone, female   DOB: 1941-06-23, 78 y.o.   MRN: [id number]   Patricia Malone 12/21/2018, 9:54 AM
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
## 2018-12-21 NOTE — Progress Notes (Signed)
Rehab Admissions Coordinator Note:  Patient was screened by Michel Santee for appropriateness for an Inpatient Acute Rehab Consult.  At this time, note OT is recommending SNF with patient low tolerance 2/2 pain. Will continue to follow from a distance for 1-2 days to determine whether pt can tolerate CIR level therapies before requesting an order.   Michel Santee 12/21/2018, 10:25 AM  I can be reached at [phone].

## 2018-12-21 NOTE — Progress Notes (Signed)
Subjective: The patient is alert and pleasant.  She complains of back pain and right leg pain.  She has not been mobilized.  She asked me to call her son  Objective: Vital signs in last 24 hours: Temp:  [97.7 F (36.5 C)-98.6 F (37 C)] 98.2 F (36.8 C) (06/06 0831) Pulse Rate:  [65-94] 73 (06/06 0831) Resp:  [12-21] 16 (06/06 0831) BP: (121-167)/(51-85) 131/69 (06/06 0831) SpO2:  [95 %-100 %] 99 % (06/06 0831) Estimated body mass index is 23.17 kg/m as calculated from the following:   Height as of this encounter: 5\' 4"  (1.626 m).   Weight as of this encounter: 61.2 kg.   Intake/Output from previous day: 06/05 0701 - 06/06 0700 In: 9509 [P.O.:240; I.V.:1415; IV Piggyback:50] Out: 1870 [TOIZT:2458; Blood:20] Intake/Output this shift: No intake/output data recorded.  Physical exam the patient is alert and pleasant.  She is moving her lower extremities well.  Lab Results: Recent Labs    12/20/18 0109 12/21/18 0248  WBC 11.7* 16.4*  HGB 11.8* 11.4*  HCT 36.5 34.7*  PLT 183 172   BMET Recent Labs    12/20/18 0109 12/21/18 0248  NA 140 138  K 3.5 3.5  CL 107 103  CO2 27 25  GLUCOSE 99 117*  BUN 16 17  CREATININE 0.94 0.85  CALCIUM 8.5* 8.4*    Studies/Results: Dg Thoracolumabar Spine  Result Date: 12/20/2018 CLINICAL DATA:  Thoracolumbar fusion. EXAM: DG C-ARM 61-120 MIN; THORACOLUMBAR SPINE - 2 VIEW COMPARISON:  MRI 12/19/2018.  CT's 12/18/2018. FINDINGS: Thoracolumbar spine numbered with the lowest ribbed vertebrae as T12. Thoracolumbar fusion which appears to be at the level of T12 through L2 noted. Hardware intact. Anatomic alignment. No acute bony abnormality. NG tube noted 4 images obtained. 3 minutes 45 seconds fluoroscopy time utilized. IMPRESSION: T12-L2 posterior fusion.  Hardware intact.  Anatomic alignment. Electronically Signed   By: Marcello Moores  Register   On: 12/20/2018 10:45   Dg C-arm 1-60 Min  Result Date: 12/20/2018 CLINICAL DATA:  Thoracolumbar  fusion. EXAM: DG C-ARM 61-120 MIN; THORACOLUMBAR SPINE - 2 VIEW COMPARISON:  MRI 12/19/2018.  CT's 12/18/2018. FINDINGS: Thoracolumbar spine numbered with the lowest ribbed vertebrae as T12. Thoracolumbar fusion which appears to be at the level of T12 through L2 noted. Hardware intact. Anatomic alignment. No acute bony abnormality. NG tube noted 4 images obtained. 3 minutes 45 seconds fluoroscopy time utilized. IMPRESSION: T12-L2 posterior fusion.  Hardware intact.  Anatomic alignment. Electronically Signed   By: [place]   On: 12/20/2018 10:45    Assessment/Plan: Postop day #1: We will get the patient a TLSO and mobilize her with PT and OT.  She may need rehab.  I spoke with her son and answered all his questions.  LOS: 1 day     Patricia Malone 12/21/2018, 9:00 AM
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
## 2018-12-21 NOTE — Plan of Care (Signed)
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
## 2018-12-21 NOTE — Progress Notes (Signed)
1500 Assisted pt to bedside commode, stood up and had few steps at the bedside, min-mod assist. Pt voided w/o difficulty, 250 ml.

## 2018-12-21 NOTE — Plan of Care (Signed)

## 2018-12-21 NOTE — Progress Notes (Signed)
Bladder scan 379mL. Per Florene Glen, MD bladder scan again at 1500 if pt has not voided by that time. Will continue to monitor.

## 2018-12-22 ENCOUNTER — Encounter (HOSPITAL_COMMUNITY): Payer: Self-pay | Admitting: *Deleted

## 2018-12-22 LAB — CBC
HCT: 34.4 % — ABNORMAL LOW (ref 36.0–46.0)
Hemoglobin: 11.3 g/dL — ABNORMAL LOW (ref 12.0–15.0)
MCH: 29.1 pg (ref 26.0–34.0)
MCHC: 32.8 g/dL (ref 30.0–36.0)
MCV: 88.7 fL (ref 80.0–100.0)
Platelets: 179 10*3/uL (ref 150–400)
RBC: 3.88 MIL/uL (ref 3.87–5.11)
RDW: 13 % (ref 11.5–15.5)
WBC: 13.2 10*3/uL — ABNORMAL HIGH (ref 4.0–10.5)
nRBC: 0 % (ref 0.0–0.2)

## 2018-12-22 LAB — COMPREHENSIVE METABOLIC PANEL
ALT: 17 U/L (ref 0–44)
AST: 30 U/L (ref 15–41)
Albumin: 2.9 g/dL — ABNORMAL LOW (ref 3.5–5.0)
Alkaline Phosphatase: 46 U/L (ref 38–126)
Anion gap: 10 (ref 5–15)
BUN: 32 mg/dL — ABNORMAL HIGH (ref 8–23)
CO2: 25 mmol/L (ref 22–32)
Calcium: 8.7 mg/dL — ABNORMAL LOW (ref 8.9–10.3)
Chloride: 105 mmol/L (ref 98–111)
Creatinine, Ser: 0.78 mg/dL (ref 0.44–1.00)
GFR calc Af Amer: >60 mL/min (ref >60)
GFR calc non Af Amer: >60 mL/min (ref >60)
Glucose, Bld: 88 mg/dL (ref 70–99)
Potassium: 3.6 mmol/L (ref 3.5–5.1)
Sodium: 140 mmol/L (ref 135–145)
Total Bilirubin: 0.7 mg/dL (ref 0.3–1.2)
Total Protein: 5.2 g/dL — ABNORMAL LOW (ref 6.5–8.1)

## 2018-12-22 LAB — MAGNESIUM: Magnesium: 1.9 mg/dL (ref 1.7–2.4)

## 2018-12-22 LAB — GLUCOSE, CAPILLARY: Glucose-Capillary: 82 mg/dL (ref 70–99)

## 2018-12-22 MED ORDER — MORPHINE SULFATE (PF) 2 MG/ML IV SOLN
2.0000 mg | INTRAVENOUS | Status: DC | PRN
  Administered 2018-12-22 – 2018-12-25 (×8): 2 mg via INTRAVENOUS
  Filled 2018-12-22 (×8): qty 1

## 2018-12-22 MED ORDER — POLYETHYLENE GLYCOL 3350 17 G PO PACK
17.0000 g | PACK | Freq: Two times a day (BID) | ORAL | Status: DC
  Administered 2018-12-22 (×2): 17 g via ORAL
  Filled 2018-12-22 (×3): qty 1

## 2018-12-22 MED ORDER — ACETAMINOPHEN 325 MG PO TABS
650.0000 mg | ORAL_TABLET | Freq: Four times a day (QID) | ORAL | Status: DC | PRN
  Administered 2018-12-22: 650 mg via ORAL
  Filled 2018-12-22: qty 2
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
## 2018-12-22 NOTE — Plan of Care (Signed)
  Problem: Pain Managment: Goal: General experience of comfort will improve Outcome: Progressing   Problem: Safety: Goal: Ability to remain free from injury will improve Outcome: Progressing   Problem: Skin Integrity: Goal: Risk for impaired skin integrity will decrease Outcome: Progressing   

## 2018-12-22 NOTE — TOC Initial Note (Addendum)
Transition of Care [facility]) - Initial/Assessment Note    Patient Details  Name: Patricia Malone MRN: [id number] Date of Birth: February 19, 1941  Transition of Care [facility]) CM/SW Contact:    Bary Castilla, LCSW Phone Number: [phone] 12/22/2018, 1:58 PM  Clinical Narrative:    CSW met with pt bedside to discuss the SNF process. Patient was aware of the recommendation and has agreed to go. CSW provided patient with the bed list. Patient stated that she was like her son to make the decision about the SNF since he lives locally and she lives in [place]. CSW spoke with son [name] and informed him that the bed list would be emailed to him. CSW received approval to fax out referrals to the SNFs. CSW informed him that he would receive more follow up once bed offers are received.    2:52 pm CSW was contacted by pt's son and he stated that he would prefer [facility].                 Expected Discharge Plan: Skilled Nursing Facility Barriers to Discharge: Continued Medical Work up, SNF Pending bed offer   Patient Goals and CMS Choice Goal: To go back home CMS Medicare.gov Compare Post Acute Care list provided to:: Patient Choice offered to / list presented to : Patient  Expected Discharge Plan and Services Expected Discharge Plan: [place]       Living arrangements for the past 2 months: Single Family Home                                      Prior Living Arrangements/Services Living arrangements for the past 2 months: Single Family Home Lives with:: Self Patient language and need for interpreter reviewed:: Yes Do you feel safe going back to the place where you live?: Yes      Need for Family Participation in Patient Care: Yes (Comment) Care giver support system in place?: Yes (comment)   Criminal Activity/Legal Involvement Pertinent to Current Situation/Hospitalization: No - Comment as needed  Activities of Daily Living Home Assistive  Devices/Equipment: Hearing aid ADL Screening (condition at time of admission) Patient's cognitive ability adequate to safely complete daily activities?: Yes Is the patient deaf or have difficulty hearing?: Yes Does the patient have difficulty seeing, even when wearing glasses/contacts?: No Does the patient have difficulty concentrating, remembering, or making decisions?: No Patient able to express need for assistance with ADLs?: Yes Does the patient have difficulty dressing or bathing?: No Independently performs ADLs?: Yes (appropriate for developmental age) Does the patient have difficulty walking or climbing stairs?: No Weakness of Legs: None Weakness of Arms/Hands: None  Permission Sought/Granted Permission sought to share information with : Family Supports, Chartered certified accountant granted to share information with : Yes, Verbal Permission Granted  Share Information with NAME: [name]  Permission granted to share info w AGENCY: SNFs  Permission granted to share info w Relationship: Son  Permission granted to share info w Contact Information: [phone]  Emotional Assessment Appearance:: Appears stated age Attitude/Demeanor/Rapport: Engaged Affect (typically observed): Accepting Orientation: : Oriented to Self, Oriented to Place, Oriented to  Time, Oriented to Situation Alcohol / Substance Use: Never Used Psych Involvement: No (comment)  Admission diagnosis:  Paresthesias/numbness [R20.9] Closed compression fracture of body of L1 vertebra ([place]) [S32.010A] Patient Active Problem List   Diagnosis Date Noted  .  Hypertension 12/19/2018  . CAD (coronary artery disease) 12/19/2018  . Lumbar compression fracture, closed, initial encounter ([place]) 12/19/2018  . Leukocytosis 12/19/2018  . Closed fracture of first lumbar vertebra ([place]) 12/19/2018   PCP:  System, [place] Not In Pharmacy:   [place], [place] - [address] [place] [address] Phone: [phone] Fax: [phone]     Social Determinants of Health (SDOH) Interventions    Readmission Risk Interventions No flowsheet data found.
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
## 2018-12-22 NOTE — Progress Notes (Signed)
Pt assisted back to bed by RN and NT from chair. Pt c/o of nausea and vomiting when sitting on edge of bed. Pt also stated that she saw little purple flies while sitting in the chair but did not see them when back in bed. Pt now resting comfortably in bed and denies any more nausea. Poor oral and PO intake despite encouragement from staff.

## 2018-12-22 NOTE — NC FL2 (Signed)
[place] LEVEL OF CARE SCREENING TOOL     IDENTIFICATION  Patient Name: Patricia Malone Birthdate: Nov 05, 1940 Sex: female Admission Date (Current Location): 12/18/2018  [facility] and [place] Number:  Herbalist and Address:  [facility], [place] [address] [place], [place] [address]      Provider Number: [id number]  Attending Physician Name and Address:  Elodia Florence., *  Relative Name and Phone Number:  [name] [phone]    Current Level of Care: Hospital Recommended Level of Care: [place] Prior Approval Number:    Date Approved/Denied:   PASRR Number: Under manual review  Discharge Plan: SNF    Current Diagnoses: Patient Active Problem List   Diagnosis Date Noted  . Hypertension 12/19/2018  . CAD (coronary artery disease) 12/19/2018  . Lumbar compression fracture, closed, initial encounter ([place]) 12/19/2018  . Leukocytosis 12/19/2018  . Closed fracture of first lumbar vertebra ([place]) 12/19/2018    Orientation RESPIRATION BLADDER Height & Weight     Self, Time, Situation, Place  Normal Continent Weight: 135 lb (61.2 kg) Height:  5\' 4"  (162.6 cm)  BEHAVIORAL SYMPTOMS/MOOD NEUROLOGICAL BOWEL NUTRITION STATUS      Continent Diet  AMBULATORY STATUS COMMUNICATION OF NEEDS Skin   Extensive Assist Verbally Skin abrasions                       Personal Care Assistance Level of Assistance  Bathing, Feeding, Dressing Bathing Assistance: Maximum assistance Feeding assistance: Limited assistance Dressing Assistance: Maximum assistance     Functional Limitations Info  Sight, Hearing Sight Info: Adequate Hearing Info: Impaired(hearing aide)      SPECIAL CARE FACTORS FREQUENCY  PT (By licensed PT), OT (By licensed OT)     PT Frequency: 5 times per week OT Frequency: 5 times per week            Contractures Contractures Info: Not present    Additional Factors Info  Code Status,  Allergies Code Status Info: Full Allergies Info: [place]           Current Medications (12/22/2018):  This is the current hospital active medication list Current Facility-Administered Medications  Medication Dose Route Frequency Provider Last Rate Last Dose  . methocarbamol (ROBAXIN) tablet 500 mg  500 mg Oral Q8H PRN Opyd, Ilene Qua, MD   500 mg at 12/22/18 1215  . metoprolol tartrate (LOPRESSOR) tablet 25 mg  25 mg Oral Daily Opyd, Ilene Qua, MD   25 mg at 12/22/18 1040  . morphine 2 MG/ML injection 2 mg  2 mg Intravenous Q4H PRN Elodia Florence., MD      . ondansetron [facility]) tablet 4 mg  4 mg Oral Q6H PRN Opyd, Ilene Qua, MD   4 mg at 12/19/18 0616   Or  . ondansetron (ZOFRAN) injection 4 mg  4 mg Intravenous Q6H PRN Opyd, Ilene Qua, MD   4 mg at 12/20/18 0515  . oxyCODONE (Oxy IR/ROXICODONE) immediate release tablet 5 mg  5 mg Oral Q4H PRN Elodia Florence., MD   5 mg at 12/22/18 1215  . pantoprazole (PROTONIX) EC tablet 40 mg  40 mg Oral Daily Opyd, Ilene Qua, MD   40 mg at 12/22/18 1040  . polyethylene glycol (MIRALAX / GLYCOLAX) packet 17 g  17 g Oral BID Elodia Florence., MD   17 g at 12/22/18 1040  . pravastatin (PRAVACHOL) tablet 40 mg  40 mg Oral q1800 Briscoe Deutscherpyd, Timothy S, MD   40 mg at 12/21/18 1726     Discharge Medications: Please see discharge summary for a list of discharge medications.  Relevant Imaging Results:  Relevant Lab Results:   Additional Information SS# [id number]  Berlene Dixson E ThompsonBracken, [place]CS[place]

## 2018-12-22 NOTE — Progress Notes (Signed)
Orthopedic Tech Progress Note Patient Details:  Patricia Malone 29-Jun-1941 [id number] Had to call in to [facility]. RN called and said patient brace is to tight. Patient ID: Patricia Malone, female   DOB: 1941-05-18, 78 y.o.   MRN: [id number]   [name] 12/22/2018, 5:03 PM
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
## 2018-12-23 ENCOUNTER — Encounter (HOSPITAL_COMMUNITY): Payer: Self-pay | Admitting: Neurological Surgery

## 2018-12-23 ENCOUNTER — Inpatient Hospital Stay (HOSPITAL_COMMUNITY): Payer: Medicare Other

## 2018-12-23 LAB — COMPREHENSIVE METABOLIC PANEL
ALT: 17 U/L (ref 0–44)
AST: 23 U/L (ref 15–41)
Albumin: 2.8 g/dL — ABNORMAL LOW (ref 3.5–5.0)
Alkaline Phosphatase: 44 U/L (ref 38–126)
Anion gap: 9 (ref 5–15)
BUN: 35 mg/dL — ABNORMAL HIGH (ref 8–23)
CO2: 23 mmol/L (ref 22–32)
Calcium: 8.5 mg/dL — ABNORMAL LOW (ref 8.9–10.3)
Chloride: 109 mmol/L (ref 98–111)
Creatinine, Ser: 0.8 mg/dL (ref 0.44–1.00)
GFR calc Af Amer: >60 mL/min (ref >60)
GFR calc non Af Amer: >60 mL/min (ref >60)
Glucose, Bld: 95 mg/dL (ref 70–99)
Potassium: 3.6 mmol/L (ref 3.5–5.1)
Sodium: 141 mmol/L (ref 135–145)
Total Bilirubin: 1 mg/dL (ref 0.3–1.2)
Total Protein: 5.5 g/dL — ABNORMAL LOW (ref 6.5–8.1)

## 2018-12-23 LAB — OCCULT BLOOD X 1 CARD TO LAB, STOOL: Fecal Occult Bld: POSITIVE — AB

## 2018-12-23 LAB — ABO/RH: ABO/RH(D): O POS

## 2018-12-23 LAB — CBC
HCT: 31.5 % — ABNORMAL LOW (ref 36.0–46.0)
Hemoglobin: 10.2 g/dL — ABNORMAL LOW (ref 12.0–15.0)
MCH: 28.5 pg (ref 26.0–34.0)
MCHC: 32.4 g/dL (ref 30.0–36.0)
MCV: 88 fL (ref 80.0–100.0)
Platelets: 200 10*3/uL (ref 150–400)
RBC: 3.58 MIL/uL — ABNORMAL LOW (ref 3.87–5.11)
RDW: 12.8 % (ref 11.5–15.5)
WBC: 11.2 10*3/uL — ABNORMAL HIGH (ref 4.0–10.5)
nRBC: 0 % (ref 0.0–0.2)

## 2018-12-23 LAB — HEMOGLOBIN AND HEMATOCRIT, BLOOD
HCT: 30.7 % — ABNORMAL LOW (ref 36.0–46.0)
HCT: 29 % — ABNORMAL LOW (ref 36.0–46.0)
Hemoglobin: 10.1 g/dL — ABNORMAL LOW (ref 12.0–15.0)
Hemoglobin: 9.5 g/dL — ABNORMAL LOW (ref 12.0–15.0)

## 2018-12-23 LAB — GLUCOSE, CAPILLARY: Glucose-Capillary: 115 mg/dL — ABNORMAL HIGH (ref 70–99)

## 2018-12-23 LAB — MAGNESIUM: Magnesium: 1.8 mg/dL (ref 1.7–2.4)

## 2018-12-23 MED ORDER — LACTATED RINGERS IV SOLN
INTRAVENOUS | Status: AC
  Administered 2018-12-23: 18:00:00 via INTRAVENOUS

## 2018-12-23 MED ORDER — SODIUM CHLORIDE 0.9 % IV SOLN
INTRAVENOUS | Status: DC
  Administered 2018-12-23 – 2018-12-25 (×2): via INTRAVENOUS

## 2018-12-23 MED ORDER — TAMSULOSIN HCL 0.4 MG PO CAPS
0.4000 mg | ORAL_CAPSULE | Freq: Every day | ORAL | Status: DC
  Administered 2018-12-23 – 2018-12-29 (×7): 0.4 mg via ORAL
  Filled 2018-12-23 (×7): qty 1

## 2018-12-23 MED ORDER — PANTOPRAZOLE SODIUM 40 MG IV SOLR: 40.0000 mg | Freq: Two times a day (BID) | INTRAVENOUS | Status: DC

## 2018-12-23 MED ORDER — PANTOPRAZOLE SODIUM 40 MG IV SOLR
40.0000 mg | INTRAVENOUS | Status: AC
  Administered 2018-12-23: 13:00:00 40 mg via INTRAVENOUS
  Filled 2018-12-23: qty 40

## 2018-12-23 MED ORDER — ACETAMINOPHEN 325 MG PO TABS
650.0000 mg | ORAL_TABLET | Freq: Four times a day (QID) | ORAL | Status: DC
  Administered 2018-12-23 – 2018-12-30 (×23): 650 mg via ORAL
  Filled 2018-12-23 (×24): qty 2

## 2018-12-23 MED ORDER — PANTOPRAZOLE SODIUM 40 MG IV SOLR
40.0000 mg | Freq: Two times a day (BID) | INTRAVENOUS | Status: DC
  Administered 2018-12-23 – 2018-12-30 (×14): 40 mg via INTRAVENOUS
  Filled 2018-12-23 (×15): qty 40

## 2018-12-23 MED ORDER — MORPHINE SULFATE (PF) 2 MG/ML IV SOLN: 2.0000 mg | Freq: Once | INTRAVENOUS | Status: DC | PRN

## 2018-12-23 NOTE — Progress Notes (Signed)
PT Cancellation Note  Patient Details Name: Patricia Malone MRN: [id number] DOB: 1940/10/07   Cancelled Treatment:    Reason Eval/Treat Not Completed: (P) Pain limiting ability to participate;Medical issues which prohibited therapy(Pt with intense pain, dark black diarrhea ( all am) and actively vomitting.  Will return this pm if presentation improves.  If not will f/u per POC.  )   [place] Sylvan Eli Hose 12/23/2018, 11:31 AM  Governor Rooks, PTA Acute Rehabilitation Services Pager [phone] Office [phone]

## 2018-12-23 NOTE — TOC Progression Note (Signed)
Transition of Care [facility]) - Progression Note    Patient Details  Name: Patricia Malone MRN: [id number] Date of Birth: 10/06/1940  Transition of Care [facility]) CM/SW [place],  Phone Number: [phone] 12/23/2018, 4:12 PM  Clinical Narrative:     CSW consulted with patient's son [name] regarding bed offers. [name] is very excited patient was accepted to [facility] as his friend has told him great things. CSW notes patient has continued medical workup at this time, and informed [name] that we can be hopeful [name] will have a bed when patient is medically ready but no guarantees. [name] expressed understanding. [name] has been updated on patient's acceptance of bed offer, will continue to keep updated on patient's medical status. CSW will continue to follow for patient's discharge planning when medically improved.   Expected Discharge Plan: [place] Barriers to Discharge: Continued Medical Work up, SNF Pending bed offer  Expected Discharge Plan and Services Expected Discharge Plan: [place] arrangements for the past 2 months: Single Family Home                                       Social Determinants of Health (SDOH) Interventions    Readmission Risk Interventions No flowsheet data found.
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
## 2018-12-23 NOTE — Care Management Important Message (Signed)
Important Message  Patient Details  Name: Patricia Malone MRN: [id number] Date of Birth: 15-Nov-1940   Medicare Important Message Given:  Yes    Memory Argue 12/23/2018, 4:32 PM
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
## 2018-12-23 NOTE — Progress Notes (Signed)
MD paged pt is having black diarrhea. awaiting call back for further instructions
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
## 2018-12-23 NOTE — Progress Notes (Signed)
Neurosurgery Service Progress Note  Subjective: No acute events overnight, started having melena this morning   Objective: Vitals:   12/22/18 1457 12/22/18 1957 12/23/18 0354 12/23/18 0748  BP: (!) 142/70 (!) 148/75 (!) 138/54 129/60  Pulse: 86 66 72 81  Resp:  16 16 18   Temp:  98.4 F (36.9 C) 98.2 F (36.8 C) 98.2 F (36.8 C)  TempSrc:  Oral Oral Oral  SpO2:  95% 95% 97%  Weight:      Height:       Temp (24hrs), Avg:98.3 F (36.8 C), Min:98.2 F (36.8 C), Max:98.4 F (36.9 C)  CBC Latest Ref Rng & Units 12/23/2018 12/23/2018 12/22/2018  WBC 4.0 - 10.5 K/uL - 11.2(H) 13.2(H)  Hemoglobin 12.0 - 15.0 g/dL 10.1(L) 10.2(L) 11.3(L)  Hematocrit 36.0 - 46.0 % 30.7(L) 31.5(L) 34.4(L)  Platelets 150 - 400 K/uL - 200 179   BMP Latest Ref Rng & Units 12/23/2018 12/22/2018 12/21/2018  Glucose 70 - 99 mg/dL 95 88 117(H)  BUN 8 - 23 mg/dL 35(H) 32(H) 17  Creatinine 0.44 - 1.00 mg/dL 0.80 0.78 0.85  Sodium 135 - 145 mmol/L 141 140 138  Potassium 3.5 - 5.1 mmol/L 3.6 3.6 3.5  Chloride 98 - 111 mmol/L 109 105 103  CO2 22 - 32 mmol/L 23 25 25   Calcium 8.9 - 10.3 mg/dL 8.5(L) 8.7(L) 8.4(L)    Intake/Output Summary (Last 24 hours) at 12/23/2018 1341 Last data filed at 12/23/2018 1158 Gross per 24 hour  Intake 234 ml  Output 1477 ml  Net -1243 ml    Current Facility-Administered Medications:  .  acetaminophen (TYLENOL) tablet 650 mg, 650 mg, Oral, Q6H PRN, Elodia Florence., MD, 650 mg at 12/22/18 2033 .  methocarbamol (ROBAXIN) tablet 500 mg, 500 mg, Oral, Q8H PRN, Opyd, Ilene Qua, MD, 500 mg at 12/23/18 7672 .  metoprolol tartrate (LOPRESSOR) tablet 25 mg, 25 mg, Oral, Daily, Opyd, Timothy S, MD, 25 mg at 12/23/18 0900 .  morphine 2 MG/ML injection 2 mg, 2 mg, Intravenous, Q4H PRN, Elodia Florence., MD, 2 mg at 12/23/18 0901 .  ondansetron (ZOFRAN) tablet 4 mg, 4 mg, Oral, Q6H PRN, 4 mg at 12/19/18 0616 **OR** ondansetron (ZOFRAN) injection 4 mg, 4 mg, Intravenous, Q6H PRN, Opyd,  Ilene Qua, MD, 4 mg at 12/23/18 1133 .  oxyCODONE (Oxy IR/ROXICODONE) immediate release tablet 5 mg, 5 mg, Oral, Q4H PRN, Elodia Florence., MD, 5 mg at 12/23/18 1320 .  pantoprazole (PROTONIX) injection 40 mg, 40 mg, Intravenous, Q12H, Elodia Florence., MD .  pravastatin (PRAVACHOL) tablet 40 mg, 40 mg, Oral, q1800, Opyd, Ilene Qua, MD, 40 mg at 12/22/18 1809 .  tamsulosin (FLOMAX) capsule 0.4 mg, 0.4 mg, Oral, QPC supper, Elodia Florence., MD   Physical Exam: AOx3, PERRL, EOMI, FS, Strength 5/5 x4, SILTx4  Assessment & Plan: 78 y.o. woman w/ L1 burst frx s/p T12-L2 percutaneous pedicle screw fixation, c/c/b melena.  -okay to go to endoscopy or other procedures as needed, no restrictions in activity, position (can be on left side for the entire procedure), or medications -no change in neurosurgical plan of care, will update her son via telephone  Judith Part  12/23/18 1:41 PM
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
## 2018-12-24 ENCOUNTER — Encounter (HOSPITAL_COMMUNITY): Payer: Self-pay | Admitting: Certified Registered"

## 2018-12-24 ENCOUNTER — Inpatient Hospital Stay (HOSPITAL_COMMUNITY): Payer: Medicare Other | Admitting: Certified Registered"

## 2018-12-24 LAB — CBC
HCT: 26.3 % — ABNORMAL LOW (ref 36.0–46.0)
Hemoglobin: 8.5 g/dL — ABNORMAL LOW (ref 12.0–15.0)
MCH: 28.5 pg (ref 26.0–34.0)
MCHC: 32.3 g/dL (ref 30.0–36.0)
MCV: 88.3 fL (ref 80.0–100.0)
Platelets: 216 10*3/uL (ref 150–400)
RBC: 2.98 MIL/uL — ABNORMAL LOW (ref 3.87–5.11)
RDW: 12.9 % (ref 11.5–15.5)
WBC: 10.7 10*3/uL — ABNORMAL HIGH (ref 4.0–10.5)
nRBC: 0 % (ref 0.0–0.2)

## 2018-12-24 LAB — MAGNESIUM: Magnesium: 1.8 mg/dL (ref 1.7–2.4)

## 2018-12-24 LAB — HEMOGLOBIN AND HEMATOCRIT, BLOOD
HCT: 23.1 % — ABNORMAL LOW (ref 36.0–46.0)
HCT: 23.6 % — ABNORMAL LOW (ref 36.0–46.0)
HCT: 26.1 % — ABNORMAL LOW (ref 36.0–46.0)
Hemoglobin: 7.6 g/dL — ABNORMAL LOW (ref 12.0–15.0)
Hemoglobin: 7.7 g/dL — ABNORMAL LOW (ref 12.0–15.0)
Hemoglobin: 8.7 g/dL — ABNORMAL LOW (ref 12.0–15.0)

## 2018-12-24 LAB — COMPREHENSIVE METABOLIC PANEL
ALT: 16 U/L (ref 0–44)
AST: 23 U/L (ref 15–41)
Albumin: 2.6 g/dL — ABNORMAL LOW (ref 3.5–5.0)
Alkaline Phosphatase: 46 U/L (ref 38–126)
Anion gap: 11 (ref 5–15)
BUN: 37 mg/dL — ABNORMAL HIGH (ref 8–23)
CO2: 22 mmol/L (ref 22–32)
Calcium: 8.3 mg/dL — ABNORMAL LOW (ref 8.9–10.3)
Chloride: 105 mmol/L (ref 98–111)
Creatinine, Ser: 0.87 mg/dL (ref 0.44–1.00)
GFR calc Af Amer: >60 mL/min (ref >60)
GFR calc non Af Amer: >60 mL/min (ref >60)
Glucose, Bld: 131 mg/dL — ABNORMAL HIGH (ref 70–99)
Potassium: 3.5 mmol/L (ref 3.5–5.1)
Sodium: 138 mmol/L (ref 135–145)
Total Bilirubin: 1.3 mg/dL — ABNORMAL HIGH (ref 0.3–1.2)
Total Protein: 5.1 g/dL — ABNORMAL LOW (ref 6.5–8.1)

## 2018-12-24 LAB — GLUCOSE, CAPILLARY: Glucose-Capillary: 107 mg/dL — ABNORMAL HIGH (ref 70–99)

## 2018-12-24 LAB — PREPARE RBC (CROSSMATCH)

## 2018-12-24 LAB — PROTIME-INR
INR: 1.1 (ref 0.8–1.2)
Prothrombin Time: 14.3 seconds (ref 11.4–15.2)

## 2018-12-24 MED ORDER — OXYCODONE HCL 5 MG PO TABS
5.0000 mg | ORAL_TABLET | ORAL | Status: DC | PRN
  Administered 2018-12-25 (×3): 5 mg via ORAL
  Filled 2018-12-24 (×3): qty 1

## 2018-12-24 MED ORDER — OXYCODONE HCL 5 MG PO TABS
10.0000 mg | ORAL_TABLET | ORAL | Status: DC | PRN
  Administered 2018-12-24 – 2018-12-26 (×7): 10 mg via ORAL
  Filled 2018-12-24 (×7): qty 2

## 2018-12-24 MED ORDER — LACTATED RINGERS IV BOLUS
500.0000 mL | Freq: Once | INTRAVENOUS | Status: AC
  Administered 2018-12-24: 22:00:00 500 mL via INTRAVENOUS

## 2018-12-24 MED ORDER — LIDOCAINE 5 % EX PTCH
1.0000 | MEDICATED_PATCH | CUTANEOUS | Status: DC
  Administered 2018-12-24 – 2018-12-29 (×6): 1 via TRANSDERMAL
  Filled 2018-12-24 (×8): qty 1

## 2018-12-24 MED ORDER — SODIUM CHLORIDE 0.9% IV SOLUTION
Freq: Once | INTRAVENOUS | Status: AC
  Administered 2018-12-24: via INTRAVENOUS
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
## 2018-12-24 NOTE — Progress Notes (Signed)
Inpatient Rehab Admissions Coordinator:   Note that PT/OT continue to recommend SNF.  Will sign off at this time.   Shann Medal, PT, DPT Admissions Coordinator [phone] 12/24/18  4:58 PM

## 2018-12-24 NOTE — Progress Notes (Signed)
Subjective: No further blood in stools.  Objective: Vital signs in last 24 hours: Temp:  [98.2 F (36.8 C)-98.6 F (37 C)] 98.6 F (37 C) (06/09 0331) Pulse Rate:  [79-83] 79 (06/09 0331) Resp:  [15-18] 15 (06/09 0331) BP: (110-128)/(49-72) 115/49 (06/09 0331) SpO2:  [97 %-99 %] 98 % (06/09 0331) Weight change:  Last BM Date: 12/23/18  PE: GEN:  NAD, a bit pale-appearing ABD:  Soft,  Non-tender  Lab Results: CBC    Component Value Date/Time   WBC 10.7 (H) 12/24/2018 0511   RBC 2.98 (L) 12/24/2018 0511   HGB 8.5 (L) 12/24/2018 0511   HCT 26.3 (L) 12/24/2018 0511   PLT 216 12/24/2018 0511   MCV 88.3 12/24/2018 0511   MCH 28.5 12/24/2018 0511   MCHC 32.3 12/24/2018 0511   RDW 12.9 12/24/2018 0511   LYMPHSABS 1.6 12/20/2018 0109   MONOABS 0.7 12/20/2018 0109   EOSABS 0.3 12/20/2018 0109   BASOSABS 0.0 12/20/2018 0109   CMP     Component Value Date/Time   NA 138 12/24/2018 0511   K 3.5 12/24/2018 0511   CL 105 12/24/2018 0511   CO2 22 12/24/2018 0511   GLUCOSE 131 (H) 12/24/2018 0511   BUN 37 (H) 12/24/2018 0511   CREATININE 0.87 12/24/2018 0511   CALCIUM 8.3 (L) 12/24/2018 0511   PROT 5.1 (L) 12/24/2018 0511   ALBUMIN 2.6 (L) 12/24/2018 0511   AST 23 12/24/2018 0511   ALT 16 12/24/2018 0511   ALKPHOS 46 12/24/2018 0511   BILITOT 1.3 (H) 12/24/2018 0511   GFRNONAA >60 12/24/2018 0511   GFRAA >60 12/24/2018 0511   Assessment:  1.  GI bleeding suspect ulcer, no further bleeding overnight. 2.  Anemia, acute blood loss.  Changes overnight likely reflective old bleeding and re-equilibrative changes. 3.  Lumbar fracture, surgery. 4.  Frustration and agitation, I suspect degree of sundowning.  Plan:  1.  I had long discussion with son [name] over the telephone.  Given her likely sundowning and no active bleeding, we've elected to continue PPI and hold off on egd for now; if a couple days without bleeding, would likely just treat this medically, but if bleeding  recurs would likely have to consider endoscopy. 2.  CBCs, transfuse as needed. 3.  PPI. 4. [facility] will follow.   [name] 12/24/2018, 9:08 AM   Cell [phone] If no answer or after 5 PM call [phone]
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
## 2018-12-24 NOTE — Anesthesia Preprocedure Evaluation (Addendum)
Anesthesia Evaluation  Patient identified by MRN, date of birth, ID band Patient awake    Reviewed: Allergy & Precautions, Patient's Chart, lab work & pertinent test results, reviewed documented beta blocker date and time   History of Anesthesia Complications Negative for: history of anesthetic complications  Airway Mallampati: II  TM Distance: >3 FB Neck ROM: Full    Dental  (+) Teeth Intact, Dental Advidsory Given   Pulmonary neg pulmonary ROS,    Pulmonary exam normal        Cardiovascular hypertension, Pt. on home beta blockers and Pt. on medications + CAD, + Past MI and + CABG  Normal cardiovascular exam     Neuro/Psych L1 compression fracture    GI/Hepatic Neg liver ROS, GERD  Medicated and Controlled,  Endo/Other  negative endocrine ROS  Renal/GU negative Renal ROS     Musculoskeletal S/P repair of Lumbar compression Fracture 12/20/18   Abdominal   Peds  Hematology  (+) anemia , Hgb 8.5 Plt 216   Anesthesia Other Findings Day of surgery medications reviewed with the patient.  Reproductive/Obstetrics                             Anesthesia Physical  Anesthesia Plan  ASA: III  Anesthesia Plan: General   Post-op Pain Management:    Induction: Intravenous  PONV Risk Score and Plan: 4 or greater and Treatment may vary due to age or medical condition, Ondansetron and Dexamethasone  Airway Management Planned: Oral ETT  Additional Equipment:   Intra-op Plan:   Post-operative Plan: Extubation in OR  Informed Consent: I have reviewed the patients History and Physical, chart, labs and discussed the procedure including the risks, benefits and alternatives for the proposed anesthesia with the patient or authorized representative who has indicated his/her understanding and acceptance.     Dental advisory given  Plan Discussed with: CRNA  Anesthesia Plan Comments:          Anesthesia Quick Evaluation
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
## 2018-12-25 ENCOUNTER — Encounter (HOSPITAL_COMMUNITY)
Admission: EM | Disposition: A | Discharge status: Skilled Nursing Facility | Payer: Self-pay | Source: Home / Self Care | Attending: Family Medicine

## 2018-12-25 DIAGNOSIS — D72823 Leukemoid reaction: Secondary | ICD-10-CM

## 2018-12-25 DIAGNOSIS — S32010D Wedge compression fracture of first lumbar vertebra, subsequent encounter for fracture with routine healing: Secondary | ICD-10-CM

## 2018-12-25 LAB — COMPREHENSIVE METABOLIC PANEL
ALT: 16 U/L (ref 0–44)
AST: 20 U/L (ref 15–41)
Albumin: 2.5 g/dL — ABNORMAL LOW (ref 3.5–5.0)
Alkaline Phosphatase: 40 U/L (ref 38–126)
Anion gap: 8 (ref 5–15)
BUN: 17 mg/dL (ref 8–23)
CO2: 22 mmol/L (ref 22–32)
Calcium: 8.3 mg/dL — ABNORMAL LOW (ref 8.9–10.3)
Chloride: 110 mmol/L (ref 98–111)
Creatinine, Ser: 0.68 mg/dL (ref 0.44–1.00)
GFR calc Af Amer: >60 mL/min (ref >60)
GFR calc non Af Amer: >60 mL/min (ref >60)
Glucose, Bld: 101 mg/dL — ABNORMAL HIGH (ref 70–99)
Potassium: 3.6 mmol/L (ref 3.5–5.1)
Sodium: 140 mmol/L (ref 135–145)
Total Bilirubin: 1 mg/dL (ref 0.3–1.2)
Total Protein: 4.8 g/dL — ABNORMAL LOW (ref 6.5–8.1)

## 2018-12-25 LAB — TYPE AND SCREEN
ABO/RH(D): O POS
Antibody Screen: NEGATIVE
Unit division: 0

## 2018-12-25 LAB — CBC
HCT: 26.2 % — ABNORMAL LOW (ref 36.0–46.0)
Hemoglobin: 8.7 g/dL — ABNORMAL LOW (ref 12.0–15.0)
MCH: 29.4 pg (ref 26.0–34.0)
MCHC: 33.2 g/dL (ref 30.0–36.0)
MCV: 88.5 fL (ref 80.0–100.0)
Platelets: 204 10*3/uL (ref 150–400)
RBC: 2.96 MIL/uL — ABNORMAL LOW (ref 3.87–5.11)
RDW: 13.1 % (ref 11.5–15.5)
WBC: 7 10*3/uL (ref 4.0–10.5)
nRBC: 0 % (ref 0.0–0.2)

## 2018-12-25 LAB — BPAM RBC
Blood Product Expiration Date: 202007132359
ISSUE DATE / TIME: 202006092326
Unit Type and Rh: 5100

## 2018-12-25 LAB — HEMOGLOBIN AND HEMATOCRIT, BLOOD
HCT: 27.4 % — ABNORMAL LOW (ref 36.0–46.0)
HCT: 24.3 % — ABNORMAL LOW (ref 36.0–46.0)
Hemoglobin: 8.9 g/dL — ABNORMAL LOW (ref 12.0–15.0)
Hemoglobin: 8.1 g/dL — ABNORMAL LOW (ref 12.0–15.0)

## 2018-12-25 LAB — MAGNESIUM: Magnesium: 1.7 mg/dL (ref 1.7–2.4)

## 2018-12-25 SURGERY — CANCELLED PROCEDURE
Laterality: Left

## 2018-12-25 MED ORDER — SODIUM CHLORIDE 0.9 % IV SOLN
25.0000 mg | Freq: Once | INTRAVENOUS | Status: AC
  Administered 2018-12-25: 25 mg via INTRAVENOUS
  Filled 2018-12-25: qty 2

## 2018-12-25 MED ORDER — PHENOL 1.4 % MT LIQD
1.0000 | OROMUCOSAL | Status: DC | PRN
  Administered 2018-12-26: 1 via OROMUCOSAL
  Filled 2018-12-25: qty 177

## 2018-12-25 SURGICAL SUPPLY — 14 items

## 2018-12-25 NOTE — Progress Notes (Addendum)
Neurosurgery Service Progress Note  Subjective: No acute events overnight, no further melena, got 1U or pRBC for an episode of hypotension that resolved, no tachycardia  Objective: Vitals:   12/24/18 2355 12/25/18 0015 12/25/18 0241 12/25/18 0724  BP: (!) 117/46 (!) 136/53 129/89 (!) 118/47  Pulse: 80 88 82 84  Resp: 16 16 16 16   Temp: 98.8 F (37.1 C) 97.8 F (36.6 C) 97.9 F (36.6 C) 98.3 F (36.8 C)  TempSrc:  Oral Oral Oral  SpO2:  100% 98% 95%  Weight:      Height:       Temp (24hrs), Avg:98.1 F (36.7 C), Min:97.4 F (36.3 C), Max:98.8 F (37.1 C)  CBC Latest Ref Rng & Units 12/25/2018 12/24/2018 12/24/2018  WBC 4.0 - 10.5 K/uL 7.0 - -  Hemoglobin 12.0 - 15.0 g/dL 1.6(X8.7(L) 7.7(L) 7.6(L)  Hematocrit 36.0 - 46.0 % 26.2(L) 23.6(L) 23.1(L)  Platelets 150 - 400 K/uL 204 - -   BMP Latest Ref Rng & Units 12/25/2018 12/24/2018 12/23/2018  Glucose 70 - 99 mg/dL 096(E101(H) 454(U131(H) 95  BUN 8 - 23 mg/dL 17 98(J37(H) 19(J35(H)  Creatinine 0.44 - 1.00 mg/dL 4.780.68 2.950.87 6.210.80  Sodium 135 - 145 mmol/L 140 138 141  Potassium 3.5 - 5.1 mmol/L 3.6 3.5 3.6  Chloride 98 - 111 mmol/L 110 105 109  CO2 22 - 32 mmol/L 22 22 23   Calcium 8.9 - 10.3 mg/dL 8.3(L) 8.3(L) 8.5(L)    Intake/Output Summary (Last 24 hours) at 12/25/2018 1018 Last data filed at 12/25/2018 0745 Gross per 24 hour  Intake 2574.43 ml  Output 750 ml  Net 1824.43 ml    Current Facility-Administered Medications:  .  0.9 %  sodium chloride infusion, , Intravenous, Continuous, Willis Modenautlaw, William, MD, Last Rate: 20 mL/hr at 12/23/18 2230 .  acetaminophen (TYLENOL) tablet 650 mg, 650 mg, Oral, Q6H, Zigmund DanielPowell, A Caldwell Jr., MD, 650 mg at 12/25/18 [id number] .  lidocaine (LIDODERM) 5 % 1 patch, 1 patch, Transdermal, Q24H, Hannie Shoe, Clovis Puhomas A, MD, 1 patch at 12/24/18 1704 .  methocarbamol (ROBAXIN) tablet 500 mg, 500 mg, Oral, Q8H PRN, Opyd, Lavone Neriimothy S, MD, 500 mg at 12/24/18 0944 .  metoprolol tartrate (LOPRESSOR) tablet 25 mg, 25 mg, Oral, Daily, Opyd,  Lavone Neriimothy S, MD, 25 mg at 12/25/18 0919 .  morphine 2 MG/ML injection 2 mg, 2 mg, Intravenous, Q4H PRN, Zigmund DanielPowell, A Caldwell Jr., MD, 2 mg at 12/24/18 0549 .  ondansetron (ZOFRAN) tablet 4 mg, 4 mg, Oral, Q6H PRN, 4 mg at 12/19/18 0616 **OR** ondansetron (ZOFRAN) injection 4 mg, 4 mg, Intravenous, Q6H PRN, Opyd, Lavone Neriimothy S, MD, 4 mg at 12/24/18 1712 .  oxyCODONE (Oxy IR/ROXICODONE) immediate release tablet 5 mg, 5 mg, Oral, Q4H PRN, 5 mg at 12/25/18 0202 **OR** oxyCODONE (Oxy IR/ROXICODONE) immediate release tablet 10 mg, 10 mg, Oral, Q4H PRN, Zigmund DanielPowell, A Caldwell Jr., MD, 10 mg at 12/25/18 0919 .  pantoprazole (PROTONIX) injection 40 mg, 40 mg, Intravenous, Q12H, Zigmund DanielPowell, A Caldwell Jr., MD, 40 mg at 12/25/18 0919 .  pravastatin (PRAVACHOL) tablet 40 mg, 40 mg, Oral, q1800, Opyd, Lavone Neriimothy S, MD, 40 mg at 12/24/18 1704 .  tamsulosin (FLOMAX) capsule 0.4 mg, 0.4 mg, Oral, QPC supper, Zigmund DanielPowell, A Caldwell Jr., MD, 0.4 mg at 12/24/18 1704   Physical Exam: AOx3, PERRL, EOMI, FS, Strength 5/5 x4, SILTx4  Assessment & Plan: 78 y.o. woman w/ L1 burst frx s/p T12-L2 percutaneous pedicle screw fixation, c/c/b melena.  -no change in neurosurgical plan of care, will  update her son via telephone  Patricia Malone  12/25/18 10:18 AM

## 2018-12-25 NOTE — Plan of Care (Signed)
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
## 2018-12-25 NOTE — Progress Notes (Signed)
Subjective: No blood in stool per nursing x 2 days. Drop Hgb-->transfuse one unit. No abdominal pain. Ongoing back pain.  Objective: Vital signs in last 24 hours: Temp:  [97.4 F (36.3 C)-98.8 F (37.1 C)] 98.3 F (36.8 C) (06/10 0724) Pulse Rate:  [59-88] 84 (06/10 0724) Resp:  [14-16] 16 (06/10 0724) BP: (82-136)/(46-89) 118/47 (06/10 0724) SpO2:  [95 %-100 %] 95 % (06/10 0724) Weight change:  Last BM Date: 12/24/18  PE: GEN:  NAD, a bit uncomfortable-appearing ABD:  Soft  Lab Results: CBC    Component Value Date/Time   WBC 7.0 12/25/2018 0639   RBC 2.96 (L) 12/25/2018 0639   HGB 8.7 (L) 12/25/2018 0639   HCT 26.2 (L) 12/25/2018 0639   PLT 204 12/25/2018 0639   MCV 88.5 12/25/2018 0639   MCH 29.4 12/25/2018 0639   MCHC 33.2 12/25/2018 0639   RDW 13.1 12/25/2018 0639   LYMPHSABS 1.6 12/20/2018 0109   MONOABS 0.7 12/20/2018 0109   EOSABS 0.3 12/20/2018 0109   BASOSABS 0.0 12/20/2018 0109   CMP     Component Value Date/Time   NA 140 12/25/2018 0639   K 3.6 12/25/2018 0639   CL 110 12/25/2018 0639   CO2 22 12/25/2018 0639   GLUCOSE 101 (H) 12/25/2018 0639   BUN 17 12/25/2018 0639   CREATININE 0.68 12/25/2018 0639   CALCIUM 8.3 (L) 12/25/2018 0639   PROT 4.8 (L) 12/25/2018 0639   ALBUMIN 2.5 (L) 12/25/2018 0639   AST 20 12/25/2018 0639   ALT 16 12/25/2018 0639   ALKPHOS 40 12/25/2018 0639   BILITOT 1.0 12/25/2018 0639   GFRNONAA >60 12/25/2018 0639   GFRAA >60 12/25/2018 8588    Assessment:  1.  GI bleeding suspect ulcer, no overt bleeding in two days. 2.  Anemia, acute blood loss.  Changes past couple days likely reflective old bleeding and re-equilibrative changes. 3.  Lumbar fracture, surgery. 4.  Frustration and agitation, I suspect degree of sundowning.  Plan:  1.  PPI now and indefinitely upon discharge. 2.  Follow CBCs, transfuse as needed. 3.  I've had several discussions with son [name] over the past few days.  Based on these discussions  and those with patient herself, they would prefer no endoscopy unless it's a life-threatening situation, as patient's ongoing and principal problem is back troubles pre- and post-surgery. 4.  [facility] will sign-off; please call us back when/if patient/son opt for endoscopy as inpatient; otherwise, patient could avoid NSAIDs and stay on PPI (pantoprazole 40 mg bid, or the equivalent) and see GI doctor in her home region of [place] for follow-up and possible outpatient endoscopy.   [name] 12/25/2018, 9:11 AM   Cell [phone] If no answer or after 5 PM call [phone]
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
## 2018-12-26 DIAGNOSIS — S32010A Wedge compression fracture of first lumbar vertebra, initial encounter for closed fracture: Secondary | ICD-10-CM

## 2018-12-26 LAB — PREPARE RBC (CROSSMATCH)

## 2018-12-26 LAB — HEMOGLOBIN AND HEMATOCRIT, BLOOD
HCT: 21.8 % — ABNORMAL LOW (ref 36.0–46.0)
HCT: 23.1 % — ABNORMAL LOW (ref 36.0–46.0)
HCT: 23.3 % — ABNORMAL LOW (ref 36.0–46.0)
HCT: 25.6 % — ABNORMAL LOW (ref 36.0–46.0)
Hemoglobin: 7.2 g/dL — ABNORMAL LOW (ref 12.0–15.0)
Hemoglobin: 7.6 g/dL — ABNORMAL LOW (ref 12.0–15.0)
Hemoglobin: 7.7 g/dL — ABNORMAL LOW (ref 12.0–15.0)
Hemoglobin: 8.4 g/dL — ABNORMAL LOW (ref 12.0–15.0)

## 2018-12-26 LAB — URINALYSIS, ROUTINE W REFLEX MICROSCOPIC
Bilirubin Urine: NEGATIVE
Glucose, UA: NEGATIVE mg/dL
Ketones, ur: 20 mg/dL — AB
Nitrite: POSITIVE — AB
Protein, ur: NEGATIVE mg/dL
Specific Gravity, Urine: 1.021 (ref 1.005–1.030)
WBC, UA: >50 WBC/hpf — ABNORMAL HIGH (ref 0–5)
pH: 5 (ref 5.0–8.0)

## 2018-12-26 MED ORDER — OXYCODONE HCL 5 MG PO TABS
5.0000 mg | ORAL_TABLET | Freq: Four times a day (QID) | ORAL | Status: DC | PRN
  Administered 2018-12-28 – 2018-12-30 (×8): 5 mg via ORAL
  Filled 2018-12-26 (×8): qty 1

## 2018-12-26 MED ORDER — MORPHINE SULFATE (PF) 2 MG/ML IV SOLN
2.0000 mg | Freq: Four times a day (QID) | INTRAVENOUS | Status: DC | PRN
  Administered 2018-12-26 – 2018-12-29 (×10): 2 mg via INTRAVENOUS
  Filled 2018-12-26 (×10): qty 1

## 2018-12-26 MED ORDER — SODIUM CHLORIDE 0.9 % IV SOLN
INTRAVENOUS | Status: DC
  Administered 2018-12-26 – 2018-12-28 (×3): via INTRAVENOUS

## 2018-12-26 MED ORDER — SODIUM CHLORIDE 0.9 % IV SOLN
1.0000 g | Freq: Every day | INTRAVENOUS | Status: DC
  Administered 2018-12-26 – 2018-12-30 (×5): 1 g via INTRAVENOUS
  Filled 2018-12-26 (×5): qty 10

## 2018-12-26 MED ORDER — SODIUM CHLORIDE 0.9% IV SOLUTION
Freq: Once | INTRAVENOUS | Status: AC
  Administered 2018-12-26: 17:00:00 via INTRAVENOUS

## 2018-12-26 NOTE — Care Management Important Message (Signed)
Important Message  Patient Details  Name: Patricia Malone MRN: [id number] Date of Birth: February 08, 1941   Medicare Important Message Given:  Yes    Memory Argue 12/26/2018, 1:37 PM

## 2018-12-26 NOTE — Progress Notes (Signed)
PT Cancellation Note  Patient Details Name: Patricia Malone MRN: [id number] DOB: [birth year]   Cancelled Treatment:    Reason Eval/Treat Not Completed: Patient declined, no reason specified.  Pt using that she will be getting blood later as an excuse to do nothing. 12/26/2018  Donnella Sham, [place] [phone]  (pager) [phone]  (office)   Tessie Fass Niaja Stickley 12/26/2018, 3:31 PM

## 2018-12-26 NOTE — Progress Notes (Signed)
Spoke with Dr. Paulita Fujita. Explained about pts stools and her one episode of emesis this am. He stated that he would come see pt in the am, and will likely do the EGD. Pt and son made aware. Continue to monitor.

## 2018-12-26 NOTE — Progress Notes (Signed)
Spoke with pts son. Son is veryu concerned about the patient. Stating that she has not gotten any better in a week. He wants to know what the plan is. I let him know that I would pass his number on to the MDs that are in charge of his mom's case. I let him know that she has been medicated as needed for pain, Pt did not want to get into the chair this am. Pt wants to sleep. Continue to monitior
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
## 2018-12-26 NOTE — Plan of Care (Signed)
  Problem: Activity: Goal: Risk for activity intolerance will decrease Outcome: Progressing   Problem: Elimination: Goal: Will not experience complications related to bowel motility Outcome: Progressing   Problem: Pain Managment: Goal: General experience of comfort will improve Outcome: Progressing   

## 2018-12-26 NOTE — Progress Notes (Signed)
New orders received for pt regarding decreasing hgb. Called pts son to make him aware.

## 2018-12-26 NOTE — Progress Notes (Signed)
Pt continues to c/o pain in her leg. Medicated with PO meds. Pt asks when I am going to give her the good stuff.Pt has been calling for pain medications, however when I go into her room, she is sleeping. Continue to monitor.

## 2018-12-26 NOTE — Progress Notes (Signed)
CSW received call from patient's son Richardson Landry who reports he is very concerned about patient and would like a conference call with all 3 physicians that have been working with patient, this includes Dr. Lunette Stands, Dr. Zada Finders, and Dr. Paulita Fujita.   CSW informed son that Dr. Lunette Stands is attending over patient and CSW will relay information to MD. CSW notified MD of Son wanting a call for medical updates today.  [place], [place]

## 2018-12-26 NOTE — Progress Notes (Signed)
PIV consult: Arrived to pt's room and site had already been established. Cancel consult.

## 2018-12-26 NOTE — Progress Notes (Signed)
Pt noted with 2 episodes  Of tarry dark stools, one smear-like. The other medium sized. Noted Hemoglobin trending down last result noted at 7.7. Awaiting morning results.  Son [name] given updates early in shift concerning patients progress. Back upper surgical site incisions C/D/I. Pt noted forgetful at times. Bed exit alarm maintained and call light and phone in reach.

## 2018-12-27 ENCOUNTER — Inpatient Hospital Stay (HOSPITAL_COMMUNITY): Payer: Medicare Other | Admitting: Anesthesiology

## 2018-12-27 ENCOUNTER — Encounter (HOSPITAL_COMMUNITY)
Admission: EM | Disposition: A | Discharge status: Skilled Nursing Facility | Payer: Self-pay | Source: Home / Self Care | Attending: Family Medicine

## 2018-12-27 ENCOUNTER — Encounter (HOSPITAL_COMMUNITY): Payer: Self-pay

## 2018-12-27 DIAGNOSIS — D62 Acute posthemorrhagic anemia: Secondary | ICD-10-CM

## 2018-12-27 DIAGNOSIS — G92 Toxic encephalopathy: Secondary | ICD-10-CM

## 2018-12-27 DIAGNOSIS — N3000 Acute cystitis without hematuria: Secondary | ICD-10-CM

## 2018-12-27 DIAGNOSIS — K922 Gastrointestinal hemorrhage, unspecified: Secondary | ICD-10-CM

## 2018-12-27 HISTORY — PX: ESOPHAGOGASTRODUODENOSCOPY (EGD) WITH PROPOFOL: SHX5813

## 2018-12-27 LAB — TYPE AND SCREEN
ABO/RH(D): O POS
Antibody Screen: NEGATIVE
Unit division: 0

## 2018-12-27 LAB — BASIC METABOLIC PANEL
Anion gap: 11 (ref 5–15)
BUN: 27 mg/dL — ABNORMAL HIGH (ref 8–23)
CO2: 15 mmol/L — ABNORMAL LOW (ref 22–32)
Calcium: 7.9 mg/dL — ABNORMAL LOW (ref 8.9–10.3)
Chloride: 112 mmol/L — ABNORMAL HIGH (ref 98–111)
Creatinine, Ser: 0.85 mg/dL (ref 0.44–1.00)
GFR calc Af Amer: >60 mL/min (ref >60)
GFR calc non Af Amer: >60 mL/min (ref >60)
Glucose, Bld: 67 mg/dL — ABNORMAL LOW (ref 70–99)
Potassium: 3.7 mmol/L (ref 3.5–5.1)
Sodium: 138 mmol/L (ref 135–145)

## 2018-12-27 LAB — HEMOGLOBIN AND HEMATOCRIT, BLOOD
HCT: 22.6 % — ABNORMAL LOW (ref 36.0–46.0)
HCT: 23.1 % — ABNORMAL LOW (ref 36.0–46.0)
Hemoglobin: 7.4 g/dL — ABNORMAL LOW (ref 12.0–15.0)
Hemoglobin: 7.7 g/dL — ABNORMAL LOW (ref 12.0–15.0)

## 2018-12-27 LAB — CBC
HCT: 24.1 % — ABNORMAL LOW (ref 36.0–46.0)
Hemoglobin: 8 g/dL — ABNORMAL LOW (ref 12.0–15.0)
MCH: 30.2 pg (ref 26.0–34.0)
MCHC: 33.2 g/dL (ref 30.0–36.0)
MCV: 90.9 fL (ref 80.0–100.0)
Platelets: 241 10*3/uL (ref 150–400)
RBC: 2.65 MIL/uL — ABNORMAL LOW (ref 3.87–5.11)
RDW: 13.9 % (ref 11.5–15.5)
WBC: 9.2 10*3/uL (ref 4.0–10.5)
nRBC: 0 % (ref 0.0–0.2)

## 2018-12-27 LAB — BPAM RBC
Blood Product Expiration Date: 202007142359
ISSUE DATE / TIME: 202006111637
Unit Type and Rh: 5100

## 2018-12-27 SURGERY — ESOPHAGOGASTRODUODENOSCOPY (EGD) WITH PROPOFOL
Anesthesia: Monitor Anesthesia Care | Laterality: Left

## 2018-12-27 MED ORDER — SODIUM CHLORIDE 0.9 % IV SOLN: INTRAVENOUS | Status: DC

## 2018-12-27 MED ORDER — LACTATED RINGERS IV SOLN
INTRAVENOUS | Status: DC
  Administered 2018-12-27: 13:00:00 via INTRAVENOUS

## 2018-12-27 MED ORDER — PROPOFOL 500 MG/50ML IV EMUL
INTRAVENOUS | Status: DC | PRN
  Administered 2018-12-27: 75 ug/kg/min via INTRAVENOUS

## 2018-12-27 MED ORDER — FENTANYL CITRATE (PF) 100 MCG/2ML IJ SOLN: 25.0000 ug | INTRAMUSCULAR | Status: DC | PRN

## 2018-12-27 MED ORDER — ONDANSETRON HCL 4 MG/2ML IJ SOLN: 4.0000 mg | Freq: Once | INTRAMUSCULAR | Status: DC | PRN

## 2018-12-27 MED ORDER — PROPOFOL 10 MG/ML IV BOLUS
INTRAVENOUS | Status: DC | PRN
  Administered 2018-12-27 (×2): 20 mg via INTRAVENOUS

## 2018-12-27 SURGICAL SUPPLY — 15 items

## 2018-12-27 NOTE — Plan of Care (Signed)
  Problem: Education: Goal: Knowledge of General Education information will improve Description: Including pain rating scale, medication(s)/side effects and non-pharmacologic comfort measures Outcome: Progressing   Problem: Clinical Measurements: Goal: Will remain free from infection Outcome: Progressing   

## 2018-12-27 NOTE — Anesthesia Procedure Notes (Signed)
Procedure Name: MAC Date/Time: 12/27/2018 2:09 PM Performed by: Marsa Aris, CRNA Pre-anesthesia Checklist: Patient identified, Emergency Drugs available, Suction available, Patient being monitored and Timeout performed Patient Re-evaluated:Patient Re-evaluated prior to induction Oxygen Delivery Method: Nasal cannula Preoxygenation: Pre-oxygenation with 100% oxygen
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
## 2018-12-27 NOTE — Plan of Care (Signed)

## 2018-12-27 NOTE — Progress Notes (Signed)
Neurosurgery Service Progress Note  Subjective: No acute events overnight, seen in PACU after endoscopy, recovering well and seems to be back to her pre-procedure baseline, back is still sore but feels better, could be 2/2 procedural sedation  Objective: Vitals:   12/27/18 1253 12/27/18 1425 12/27/18 1433 12/27/18 1445  BP: (!) 141/53 (!) 101/43 (!) 114/38 (!) 120/35  Pulse: 66 76 69 65  Resp: 12 20 14 15   Temp: 98.6 F (37 C) 98.4 F (36.9 C)    TempSrc: Temporal Temporal    SpO2: 100% 99% 98% 98%  Weight:      Height:       Temp (24hrs), Avg:98.6 F (37 C), Min:97.7 F (36.5 C), Max:99.7 F (37.6 C)  CBC Latest Ref Rng & Units 12/27/2018 12/27/2018 12/26/2018  WBC 4.0 - 10.5 K/uL - 9.2 -  Hemoglobin 12.0 - 15.0 g/dL 7.4(L) 8.0(L) 8.4(L)  Hematocrit 36.0 - 46.0 % 22.6(L) 24.1(L) 25.6(L)  Platelets 150 - 400 K/uL - 241 -   BMP Latest Ref Rng & Units 12/27/2018 12/25/2018 12/24/2018  Glucose 70 - 99 mg/dL 67(L) 101(H) 131(H)  BUN 8 - 23 mg/dL 27(H) 17 37(H)  Creatinine 0.44 - 1.00 mg/dL 0.85 0.68 0.87  Sodium 135 - 145 mmol/L 138 140 138  Potassium 3.5 - 5.1 mmol/L 3.7 3.6 3.5  Chloride 98 - 111 mmol/L 112(H) 110 105  CO2 22 - 32 mmol/L 15(L) 22 22  Calcium 8.9 - 10.3 mg/dL 7.9(L) 8.3(L) 8.3(L)    Intake/Output Summary (Last 24 hours) at 12/27/2018 1542 Last data filed at 12/27/2018 1200 Gross per 24 hour  Intake 960 ml  Output 1750 ml  Net -790 ml    Current Facility-Administered Medications:  .  0.9 %  sodium chloride infusion, , Intravenous, Continuous, Arta Silence, MD, Last Rate: 75 mL/hr at 12/27/18 0937 .  acetaminophen (TYLENOL) tablet 650 mg, 650 mg, Oral, Q6H, Arta Silence, MD, 650 mg at 12/26/18 1708 .  cefTRIAXone (ROCEPHIN) 1 g in sodium chloride 0.9 % 100 mL IVPB, 1 g, Intravenous, Daily, Arta Silence, MD, Last Rate: 200 mL/hr at 12/27/18 0941, 1 g at 12/27/18 0941 .  lidocaine (LIDODERM) 5 % 1 patch, 1 patch, Transdermal, Q24H, Arta Silence, MD, 1  patch at 12/26/18 1719 .  methocarbamol (ROBAXIN) tablet 500 mg, 500 mg, Oral, Q8H PRN, Arta Silence, MD, 500 mg at 12/26/18 1708 .  metoprolol tartrate (LOPRESSOR) tablet 25 mg, 25 mg, Oral, Daily, Arta Silence, MD, 25 mg at 12/27/18 0930 .  morphine 2 MG/ML injection 2 mg, 2 mg, Intravenous, Q6H PRN, Arta Silence, MD, 2 mg at 12/27/18 1529 .  ondansetron (ZOFRAN) tablet 4 mg, 4 mg, Oral, Q6H PRN, 4 mg at 12/19/18 0616 **OR** ondansetron (ZOFRAN) injection 4 mg, 4 mg, Intravenous, Q6H PRN, Arta Silence, MD, 4 mg at 12/24/18 1712 .  oxyCODONE (Oxy IR/ROXICODONE) immediate release tablet 5 mg, 5 mg, Oral, Q6H PRN **OR** [DISCONTINUED] oxyCODONE (Oxy IR/ROXICODONE) immediate release tablet 10 mg, 10 mg, Oral, Q4H PRN, Elodia Florence., MD, 10 mg at 12/26/18 1230 .  pantoprazole (PROTONIX) injection 40 mg, 40 mg, Intravenous, Q12H, Arta Silence, MD, 40 mg at 12/27/18 0930 .  phenol (CHLORASEPTIC) mouth spray 1 spray, 1 spray, Mouth/Throat, PRN, Arta Silence, MD, 1 spray at 12/26/18 0841 .  pravastatin (PRAVACHOL) tablet 40 mg, 40 mg, Oral, q1800, Arta Silence, MD, 40 mg at 12/26/18 1708 .  tamsulosin (FLOMAX) capsule 0.4 mg, 0.4 mg, Oral, QPC supper, Arta Silence, MD, 0.4 mg  at 12/26/18 1708   Physical Exam: AOx3, PERRL, EOMI, FS, Strength 5/5 x4, SILTx4  Assessment & Plan: 78 y.o. woman w/ L1 burst frx s/p T12-L2 percutaneous pedicle screw fixation, c/c/b melena.  -no change in neurosurgical plan of care -I discussed with the patient that my partners will be covering for me this weekend. Pt does not seem to currently have any active neurosurgical issues, but please contact the on-call neurosurgeon if you have any concerns or questions  Jadene Pierinihomas A   12/27/18 3:42 PM

## 2018-12-27 NOTE — Transfer of Care (Signed)
Immediate Anesthesia Transfer of Care Note  Patient: Patricia Malone  Procedure(s) Performed: ESOPHAGOGASTRODUODENOSCOPY (EGD) WITH PROPOFOL (Left )  Patient Location: Endoscopy Unit  Anesthesia Type:MAC  Level of Consciousness: awake, alert  and oriented  Airway & Oxygen Therapy: Patient Spontanous Breathing and Patient connected to nasal cannula oxygen  Post-op Assessment: Report given to RN and Post -op Vital signs reviewed and stable  Post vital signs: Reviewed and stable  Last Vitals:  Vitals Value Taken Time  BP 101/43 12/27/18 1424  Temp    Pulse 79 12/27/18 1424  Resp 17 12/27/18 1424  SpO2 99 % 12/27/18 1424  Vitals shown include unvalidated device data.  Last Pain:  Vitals:   12/27/18 1253  TempSrc: Temporal  PainSc: 7       Patients Stated Pain Goal: 2 (55/20/80 2233)  Complications: No apparent anesthesia complications

## 2018-12-27 NOTE — Progress Notes (Signed)
Subjective: Past couple days some recurrent blood in stool. Ongoing issues with back pain  Objective: Vital signs in last 24 hours: Temp:  [97.7 F (36.5 C)-99.7 F (37.6 C)] 99.7 F (37.6 C) (06/12 0806) Pulse Rate:  [68-81] 81 (06/12 0806) Resp:  [16-18] 16 (06/12 0806) BP: (122-131)/(46-55) 131/50 (06/12 0806) SpO2:  [94 %-100 %] 94 % (06/12 0806) Weight change:  Last BM Date: 12/25/18  PE: GEN:  NAD, a little confused (per staff, improved from yesterday) ABD:  Soft, non-tender  Lab Results: CBC    Component Value Date/Time   WBC 9.2 12/27/2018 0545   RBC 2.65 (L) 12/27/2018 0545   HGB 8.0 (L) 12/27/2018 0545   HCT 24.1 (L) 12/27/2018 0545   PLT 241 12/27/2018 0545   MCV 90.9 12/27/2018 0545   MCH 30.2 12/27/2018 0545   MCHC 33.2 12/27/2018 0545   RDW 13.9 12/27/2018 0545   LYMPHSABS 1.6 12/20/2018 0109   MONOABS 0.7 12/20/2018 0109   EOSABS 0.3 12/20/2018 0109   BASOSABS 0.0 12/20/2018 0109   CMP     Component Value Date/Time   NA 138 12/27/2018 0545   K 3.7 12/27/2018 0545   CL 112 (H) 12/27/2018 0545   CO2 15 (L) 12/27/2018 0545   GLUCOSE 67 (L) 12/27/2018 0545   BUN 27 (H) 12/27/2018 0545   CREATININE 0.85 12/27/2018 0545   CALCIUM 7.9 (L) 12/27/2018 0545   PROT 4.8 (L) 12/25/2018 0639   ALBUMIN 2.5 (L) 12/25/2018 0639   AST 20 12/25/2018 0639   ALT 16 12/25/2018 0639   ALKPHOS 40 12/25/2018 0639   BILITOT 1.0 12/25/2018 0639   GFRNONAA >60 12/27/2018 0545   GFRAA >60 12/27/2018 0545    Assessment:  1. GI bleeding suspect ulcer, recurrent intermittent bleeding. 2. Anemia, acute blood loss. Changes past couple days likely reflective old bleeding and re-equilibrative changes. 3. Lumbar fracture, surgery. 4. Frustration and agitation, I suspect degree of sundowning.  Plan:  1.  PPI. 2.  Endoscopy today. 3.  Next step in management pending endoscopy findings.   [name] 12/27/2018, 10:50 AM   Cell [phone] If no answer  or after 5 PM call [phone]

## 2018-12-27 NOTE — Op Note (Signed)
[facility] Patient Name: Patricia AgeeMary Galati Procedure Date : 12/27/2018 MRN: [id number] Attending MD: Willis ModenaWilliam Lambert Jeanty , MD Date of Birth: 1940/12/02 CSN: [id number] Age: 78 Admit Type: Inpatient Procedure:                Upper GI endoscopy Indications:              Acute post hemorrhagic anemia, Melena Providers:                Willis ModenaWilliam Christianne Zacher, MD, Zoe LanJenny Kappus, RN, Kandice RobinsonsGuillaume                            [place], Technician Referring MD:              Medicines:                Monitored Anesthesia Care Complications:            No immediate complications. Estimated Blood Loss:     Estimated blood loss: none. Procedure:                Pre-Anesthesia Assessment:                           - Prior to the procedure, a History and Physical                            was performed, and patient medications and                            allergies were reviewed. The patient's tolerance of                            previous anesthesia was also reviewed. The risks                            and benefits of the procedure and the sedation                            options and risks were discussed with the patient.                            All questions were answered, and informed consent                            was obtained. Prior Anticoagulants: The patient has                            taken no previous anticoagulant or antiplatelet                            agents. ASA Grade Assessment: III - A patient with                            severe systemic disease. After reviewing the risks  and benefits, the patient was deemed in                            satisfactory condition to undergo the procedure.                           After obtaining informed consent, the endoscope was                            passed under direct vision. Throughout the                            procedure, the patient's blood pressure, pulse, and                            oxygen  saturations were monitored continuously. The                            [id number]-H190 ([id number]) Olympus gastroscope was                            introduced through the mouth, and advanced to the                            second part of duodenum. The upper GI endoscopy was                            accomplished without difficulty. The patient                            tolerated the procedure well. Scope In: Scope Out: Findings:      A medium-sized hiatal hernia was present.      The exam of the esophagus was otherwise normal.      Diffuse moderately congested mucosa was found on the anterior wall of       the gastric antrum.      A few localized, small non-bleeding erosions were found in the gastric       antrum. There were no stigmata of recent bleeding.      Couple small gastric polyps, benign-appearing, suspect fundic gland       polyps. Couple small punctate red spots in cardia, not typical of AVMs,       no bleeding, most typical of remote NGT trauma. The exam of the stomach       was otherwise normal.      Diffuse moderately congested mucosa without active bleeding and with no       stigmata of bleeding was found in the duodenal bulb and in the first       portion of the duodenum.      The exam of the duodenum was otherwise normal.      No old or fresh blood was seen to the extent of our examination. Impression:               - Medium-sized hiatal hernia.                           -  Congestive gastropathy.                           - Non-bleeding erosive gastropathy.                           - Few small gastric polyps, proximal stomach, most                            consistent with fundic gland polyps.                           - Punctate erythema, small, couple regions, not                            typical of AVMs, not bleeding, most typical of NGT                            trauma.                           - Congested duodenal mucosa.                           - No old  or fresh blood seen to extent of our                            examination. Moderate Sedation:      None Recommendation:           - Continue present medications including PPI.                           - Can have liquid diet.                           - If persistent melena and drop in Hgb, might                            consider capsule endoscopy. Doubt patient is in any                            shape for colonoscopy at the present time, due to                            logistical issues of bowel preparation after recent                            back surgery.                           Deboraha Sprang- [facility] will follow. Procedure Code(s):        --- Professional ---                           [id number], Esophagogastroduodenoscopy, flexible,  transoral; diagnostic, including collection of                            specimen(s) by brushing or washing, when performed                            (separate procedure) Diagnosis Code(s):        --- Professional ---                           K44.9, Diaphragmatic hernia without obstruction or                            gangrene                           K31.89, Other diseases of stomach and duodenum                           D62, Acute posthemorrhagic anemia                           K92.1, Melena (includes Hematochezia) CPT copyright 2019 American Medical Association. All rights reserved. The codes documented in this report are preliminary and upon coder review may  be revised to meet current compliance requirements. Willis ModenaWilliam Brysten Reister, MD 12/27/2018 2:28:42 PM This report has been signed electronically. Number of Addenda: 0

## 2018-12-27 NOTE — Interval H&P Note (Signed)
History and Physical Interval Note:  12/27/2018 12:59 PM  Patricia Malone  has presented today for surgery, with the diagnosis of anemia, blood in stool.  The various methods of treatment have been discussed with the patient and family. After consideration of risks, benefits and other options for treatment, the patient has consented to  Procedure(s): ESOPHAGOGASTRODUODENOSCOPY (EGD) WITH PROPOFOL (Left) as a surgical intervention.  The patient's history has been reviewed, patient examined, no change in status, stable for surgery.  I have reviewed the patient's chart and labs.  Questions were answered to the patient's satisfaction.     [name]

## 2018-12-27 NOTE — Progress Notes (Signed)
PT Cancellation Note  Patient Details Name: Patricia Malone MRN: [id number] DOB: 1941-02-07   Cancelled Treatment:    Reason Eval/Treat Not Completed: (P) Patient at procedure or test/unavailable(Pt off unit for EGD.  Will defer tx at this time. Will f/u per POC.)   [name] Gene Eli Hose 12/27/2018, 1:02 PM Governor Rooks, PTA Acute Rehabilitation Services Pager [phone] Office [phone]

## 2018-12-27 NOTE — NC FL2 (Signed)
[place] MEDICAID FL2 LEVEL OF CARE SCREENING TOOL     IDENTIFICATION  Patient Name: Patricia Malone Birthdate: 1940-08-04 Sex: female Admission Date (Current Location): 12/18/2018  [facility] [place]   Facility and Address:  [facility], [address], [place], [place] [address]      Provider Number: [id number]  Attending Physician Name and Address:  Susa GriffinsVasireddy, Padmaja, MD  Relative Name and Phone Number:  Jeannett SeniorStephen [phone]    Current Level of Care: Hospital Recommended Level of Care: Skilled Nursing Facility Prior Approval Number:    Date Approved/Denied:   PASRR Number: [id number]  Discharge Plan: SNF    Current Diagnoses: Patient Active Problem List   Diagnosis Date Noted  . Hypertension 12/19/2018  . CAD (coronary artery disease) 12/19/2018  . Lumbar compression fracture, closed, initial encounter (HCC) 12/19/2018  . Leukocytosis 12/19/2018  . Closed fracture of first lumbar vertebra (HCC) 12/19/2018    Orientation RESPIRATION BLADDER Height & Weight     Self, Time, Situation, Place  Normal Continent Weight: 135 lb (61.2 kg) Height:  5\' 4"  (162.6 cm)  BEHAVIORAL SYMPTOMS/MOOD NEUROLOGICAL BOWEL NUTRITION STATUS      Continent Diet  AMBULATORY STATUS COMMUNICATION OF NEEDS Skin   Extensive Assist Verbally Skin abrasions                       Personal Care Assistance Level of Assistance  Bathing, Feeding, Dressing Bathing Assistance: Maximum assistance Feeding assistance: Limited assistance Dressing Assistance: Maximum assistance     Functional Limitations Info  Sight, Hearing Sight Info: Adequate Hearing Info: Impaired(hearing aide)      SPECIAL CARE FACTORS FREQUENCY  PT (By licensed PT), OT (By licensed OT)     PT Frequency: 5 times per week OT Frequency: 5 times per week            Contractures Contractures Info: Not present    Additional Factors Info  Code Status, Allergies  Code Status Info: Full Allergies Info: Codeine           Current Medications (12/27/2018):  This is the current hospital active medication list Current Facility-Administered Medications  Medication Dose Route Frequency Provider Last Rate Last Dose  . 0.9 %  sodium chloride infusion   Intravenous Continuous Willis Modenautlaw, William, MD   Stopped at 12/25/18 1459  . 0.9 %  sodium chloride infusion   Intravenous Continuous Susa GriffinsVasireddy, Padmaja, MD 75 mL/hr at 12/27/18 0937    . acetaminophen (TYLENOL) tablet 650 mg  650 mg Oral Q6H Zigmund DanielPowell, A Caldwell Jr., MD   650 mg at 12/26/18 1708  . cefTRIAXone (ROCEPHIN) 1 g in sodium chloride 0.9 % 100 mL IVPB  1 g Intravenous Daily Susa GriffinsVasireddy, Padmaja, MD 200 mL/hr at 12/27/18 0941 1 g at 12/27/18 0941  . lidocaine (LIDODERM) 5 % 1 patch  1 patch Transdermal Q24H Jadene Pierinistergard, Thomas A, MD   1 patch at 12/26/18 1719  . methocarbamol (ROBAXIN) tablet 500 mg  500 mg Oral Q8H PRN Opyd, Lavone Neriimothy S, MD   500 mg at 12/26/18 1708  . metoprolol tartrate (LOPRESSOR) tablet 25 mg  25 mg Oral Daily Opyd, Lavone Neriimothy S, MD   25 mg at 12/27/18 0930  . morphine 2 MG/ML injection 2 mg  2 mg Intravenous Q6H PRN Susa GriffinsVasireddy, Padmaja, MD   2 mg at 12/27/18 0930  . ondansetron (ZOFRAN) tablet 4 mg  4 mg Oral Q6H PRN Opyd, Lavone Neriimothy S, MD  4 mg at 12/19/18 0616   Or  . ondansetron (ZOFRAN) injection 4 mg  4 mg Intravenous Q6H PRN Opyd, Ilene Qua, MD   4 mg at 12/24/18 1712  . oxyCODONE (Oxy IR/ROXICODONE) immediate release tablet 5 mg  5 mg Oral Q6H PRN Monica Becton, MD      . pantoprazole (PROTONIX) injection 40 mg  40 mg Intravenous Q12H Elodia Florence., MD   40 mg at 12/27/18 0930  . phenol (CHLORASEPTIC) mouth spray 1 spray  1 spray Mouth/Throat PRN Monica Becton, MD   1 spray at 12/26/18 0841  . pravastatin (PRAVACHOL) tablet 40 mg  40 mg Oral q1800 Opyd, Ilene Qua, MD   40 mg at 12/26/18 1708  . tamsulosin (FLOMAX) capsule 0.4 mg  0.4 mg Oral QPC supper Elodia Florence., MD   0.4 mg at 12/26/18 1708     Discharge Medications: Please see discharge summary for a list of discharge medications.  Relevant Imaging Results:  Relevant Lab Results:   Additional Information SS# [place],

## 2018-12-27 NOTE — Anesthesia Preprocedure Evaluation (Signed)
Anesthesia Evaluation  Patient identified by MRN, date of birth, ID band Patient awake    Reviewed: Allergy & Precautions, NPO status , Patient's Chart, lab work & pertinent test results, reviewed documented beta blocker date and time   History of Anesthesia Complications Negative for: history of anesthetic complications  Airway Mallampati: II  TM Distance: >3 FB Neck ROM: Full    Dental  (+) Teeth Intact, Dental Advidsory Given   Pulmonary neg pulmonary ROS,    Pulmonary exam normal        Cardiovascular hypertension, Pt. on home beta blockers and Pt. on medications + CAD, + Past MI, + Cardiac Stents and + CABG  Normal cardiovascular exam     Neuro/Psych L1 compression fracture    GI/Hepatic Neg liver ROS, GERD  Medicated and Controlled,  Endo/Other  negative endocrine ROS  Renal/GU negative Renal ROS     Musculoskeletal S/P repair of Lumbar compression Fracture 12/20/18   Abdominal   Peds  Hematology  (+) anemia , Hgb 8.5 Plt 216   Anesthesia Other Findings Day of surgery medications reviewed with the patient.  Reproductive/Obstetrics                             Anesthesia Physical  Anesthesia Plan  ASA: III  Anesthesia Plan: MAC   Post-op Pain Management:    Induction: Intravenous  PONV Risk Score and Plan: 3 and Treatment may vary due to age or medical condition and Propofol infusion  Airway Management Planned: Nasal Cannula and Natural Airway  Additional Equipment:   Intra-op Plan:   Post-operative Plan:   Informed Consent: I have reviewed the patients History and Physical, chart, labs and discussed the procedure including the risks, benefits and alternatives for the proposed anesthesia with the patient or authorized representative who has indicated his/her understanding and acceptance.     Dental advisory given  Plan Discussed with: CRNA  Anesthesia Plan  Comments:         Anesthesia Quick Evaluation

## 2018-12-27 NOTE — Anesthesia Postprocedure Evaluation (Signed)
Anesthesia Post Note  Patient: Patricia Malone  Procedure(s) Performed: ESOPHAGOGASTRODUODENOSCOPY (EGD) WITH PROPOFOL (Left )     Patient location during evaluation: PACU Anesthesia Type: MAC Level of consciousness: awake and alert Pain management: pain level controlled Vital Signs Assessment: post-procedure vital signs reviewed and stable Respiratory status: spontaneous breathing, nonlabored ventilation, respiratory function stable and patient connected to nasal cannula oxygen Cardiovascular status: stable and blood pressure returned to baseline Postop Assessment: no apparent nausea or vomiting Anesthetic complications: no    Last Vitals:  Vitals:   12/27/18 1425 12/27/18 1433  BP: (!) 101/43 (!) 114/38  Pulse: 76 69  Resp: 20 14  Temp: 36.9 C   SpO2: 99% 98%    Last Pain:  Vitals:   12/27/18 1433  TempSrc:   PainSc: [place]

## 2018-12-28 DIAGNOSIS — G9341 Metabolic encephalopathy: Secondary | ICD-10-CM

## 2018-12-28 DIAGNOSIS — K2901 Acute gastritis with bleeding: Secondary | ICD-10-CM

## 2018-12-28 LAB — CBC
HCT: 22.8 % — ABNORMAL LOW (ref 36.0–46.0)
Hemoglobin: 7.4 g/dL — ABNORMAL LOW (ref 12.0–15.0)
MCH: 29.2 pg (ref 26.0–34.0)
MCHC: 32.5 g/dL (ref 30.0–36.0)
MCV: 90.1 fL (ref 80.0–100.0)
Platelets: 342 10*3/uL (ref 150–400)
RBC: 2.53 MIL/uL — ABNORMAL LOW (ref 3.87–5.11)
RDW: 14.1 % (ref 11.5–15.5)
WBC: 8.6 10*3/uL (ref 4.0–10.5)
nRBC: 0 % (ref 0.0–0.2)

## 2018-12-28 LAB — GLUCOSE, CAPILLARY
Glucose-Capillary: 58 mg/dL — ABNORMAL LOW (ref 70–99)
Glucose-Capillary: 58 mg/dL — ABNORMAL LOW (ref 70–99)
Glucose-Capillary: 66 mg/dL — ABNORMAL LOW (ref 70–99)
Glucose-Capillary: 88 mg/dL (ref 70–99)
Glucose-Capillary: 89 mg/dL (ref 70–99)

## 2018-12-28 LAB — HEMOGLOBIN AND HEMATOCRIT, BLOOD
HCT: 22.8 % — ABNORMAL LOW (ref 36.0–46.0)
Hemoglobin: 7.5 g/dL — ABNORMAL LOW (ref 12.0–15.0)

## 2018-12-28 NOTE — Plan of Care (Signed)
  Problem: Pain Managment: Goal: General experience of comfort will improve Outcome: Progressing   
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
## 2018-12-28 NOTE — Plan of Care (Signed)
  Problem: Nutrition: Goal: Adequate nutrition will be maintained Outcome: Progressing   Problem: Coping: Goal: Level of anxiety will decrease Outcome: Progressing   

## 2018-12-28 NOTE — Progress Notes (Signed)
Subjective: Reports feeling well rested after taking pain medications and sleeping well. Last bowel movement documented was over 24 hours ago and black. No bowel movement since endoscopy yesterday.  Objective: Vital signs in last 24 hours: Temp:  [97.7 F (36.5 C)-98.6 F (37 C)] 97.7 F (36.5 C) (06/13 0720) Pulse Rate:  [63-82] 82 (06/13 0920) Resp:  [12-20] 16 (06/13 0720) BP: (101-144)/(35-57) 144/57 (06/13 0920) SpO2:  [98 %-100 %] 100 % (06/13 0720) Weight change:  Last BM Date: 12/25/18  PE: Lying on bed, not in distress GENERAL: Mild pallor ABDOMEN: Soft, nondistended EXTREMITIES: No edema  Lab Results: Results for orders placed or performed during the hospital encounter of 12/18/18 (from the past 48 hour(s))  Prepare RBC     Status: None   Collection Time: 12/26/18  2:55 PM  Result Value Ref Range   Order Confirmation      ORDER PROCESSED BY BLOOD BANK Performed at [facility], [address] [place], [place] [address]   Type and screen [facility]     Status: None   Collection Time: 12/26/18  3:15 PM  Result Value Ref Range   ABO/RH(D) O POS    Antibody Screen NEG    Sample Expiration 12/29/2018,2359    Unit Number [id number]    Blood Component Type RED CELLS,LR    Unit division 00    Status of Unit ISSUED,FINAL    Transfusion Status OK TO TRANSFUSE    Crossmatch Result      Compatible Performed at [facility], [address] [place], [place] [address]   Hemoglobin and hematocrit, blood     Status: Abnormal   Collection Time: 12/26/18 11:08 PM  Result Value Ref Range   Hemoglobin 8.4 (L) 12.0 - 15.0 g/dL   HCT 62.125.6 (L) 30.836.0 - 65.746.0 %    Comment: Performed at [facility], [address] [place], [place] [address]  CBC     Status: Abnormal   Collection Time: 12/27/18  5:45 AM  Result Value Ref Range   WBC 9.2 4.0 - 10.5 K/uL   RBC 2.65 (L) 3.87 - 5.11 MIL/uL   Hemoglobin 8.0 (L) 12.0 - 15.0 g/dL   HCT 29.524.1  (L) 28.436.0 - 46.0 %   MCV 90.9 80.0 - 100.0 fL   MCH 30.2 26.0 - 34.0 pg   MCHC 33.2 30.0 - 36.0 g/dL   RDW 13.213.9 44.011.5 - 10.215.5 %   Platelets 241 150 - 400 K/uL   nRBC 0.0 0.0 - 0.2 %    Comment: Performed at [facility], [address] [place], [place] [address]  Basic metabolic panel     Status: Abnormal   Collection Time: 12/27/18  5:45 AM  Result Value Ref Range   Sodium 138 135 - 145 mmol/L   Potassium 3.7 3.5 - 5.1 mmol/L   Chloride 112 (H) 98 - 111 mmol/L   CO2 15 (L) 22 - 32 mmol/L   Glucose, Bld 67 (L) 70 - 99 mg/dL   BUN 27 (H) 8 - 23 mg/dL   Creatinine, Ser 6.440.85 0.44 - 1.00 mg/dL   Calcium 7.9 (L) 8.9 - 10.3 mg/dL   GFR calc non Af Amer >60 >60 mL/min   GFR calc Af Amer >60 >60 mL/min   Anion gap 11 5 - 15    Comment: Performed at [facility], [address] [place], [place] [address]  Hemoglobin and hematocrit, blood  Status: Abnormal   Collection Time: 12/27/18 11:21 AM  Result Value Ref Range   Hemoglobin 7.4 (L) 12.0 - 15.0 g/dL   HCT 22.6 (L) 36.0 - 46.0 %    Comment: Performed at [facility], [place] [address], [place], [place] [address]  Hemoglobin and hematocrit, blood     Status: Abnormal   Collection Time: 12/27/18  4:46 PM  Result Value Ref Range   Hemoglobin 7.7 (L) 12.0 - 15.0 g/dL   HCT 23.1 (L) 36.0 - 46.0 %    Comment: Performed at [facility] [address] [place], [place] [address]  Hemoglobin and hematocrit, blood     Status: Abnormal   Collection Time: 12/27/18 11:57 PM  Result Value Ref Range   Hemoglobin 7.5 (L) 12.0 - 15.0 g/dL   HCT 22.8 (L) 36.0 - 46.0 %    Comment: Performed at [facility], [place] [address] [place], [place] [address]  Glucose, capillary     Status: Abnormal   Collection Time: 12/28/18  7:49 AM  Result Value Ref Range   Glucose-Capillary 58 (L) 70 - 99 mg/dL  Glucose, capillary     Status: Abnormal   Collection Time: 12/28/18  8:13 AM  Result Value Ref Range   Glucose-Capillary 58  (L) 70 - 99 mg/dL  Glucose, capillary     Status: Abnormal   Collection Time: 12/28/18  8:34 AM  Result Value Ref Range   Glucose-Capillary 66 (L) 70 - 99 mg/dL  Glucose, capillary     Status: None   Collection Time: 12/28/18  8:59 AM  Result Value Ref Range   Glucose-Capillary 88 70 - 99 mg/dL    Studies/Results: No results found.  Medications: I have reviewed the patient's current medications.  Assessment: Melena on admission, that is post EGD on 12/27/2018 Hb has stayed stable at 7.4/7.7/7.5 Last bowel movement over 24 hours ago  Lumbar compression fracture Hypertension, coronary artery disease  Plan: Okay to advance diet to regular. Continue PPI twice daily for another 3 days, switch to Protonix 40 mg daily for 4 weeks. Monitor H&H. GI will sign off, please recall if there are further episodes of bleeding or drop in hemoglobin.   Ronnette Juniper 12/28/2018, 11:48 AM   Pager [phone] If no answer or after 5 PM call [phone]

## 2018-12-28 NOTE — Progress Notes (Signed)
Spoke to son and updated pt's son and pt's status and POC.

## 2018-12-28 NOTE — Progress Notes (Signed)
CRITICAL VALUE STICKER  CRITICAL VALUE: cbg 22  DATE & TIME NOTIFIED:   RESPONSE: Pt given OJ and crackers. Will recheck CBG in 15 min. Will continue to monitor.

## 2018-12-28 NOTE — Progress Notes (Signed)
CRITICAL VALUE STICKER  CRITICAL VALUE: cbg rechecked @ 0813 58  RESPONSE: Pt eating chicken broth.  4431 cbg 66 pt given sprite. Will recheck cbg in 15 min. Pt is asymptomatic and A&O x4. Will continue to monitor pt.

## 2018-12-29 ENCOUNTER — Encounter (HOSPITAL_COMMUNITY): Payer: Self-pay | Admitting: Gastroenterology

## 2018-12-29 DIAGNOSIS — K254 Chronic or unspecified gastric ulcer with hemorrhage: Secondary | ICD-10-CM

## 2018-12-29 LAB — CBC
HCT: 24.1 % — ABNORMAL LOW (ref 36.0–46.0)
Hemoglobin: 7.9 g/dL — ABNORMAL LOW (ref 12.0–15.0)
MCH: 29.2 pg (ref 26.0–34.0)
MCHC: 32.8 g/dL (ref 30.0–36.0)
MCV: 88.9 fL (ref 80.0–100.0)
Platelets: 333 10*3/uL (ref 150–400)
RBC: 2.71 MIL/uL — ABNORMAL LOW (ref 3.87–5.11)
RDW: 14.4 % (ref 11.5–15.5)
WBC: 7.7 10*3/uL (ref 4.0–10.5)
nRBC: 0 % (ref 0.0–0.2)

## 2018-12-29 LAB — GLUCOSE, CAPILLARY: Glucose-Capillary: 80 mg/dL (ref 70–99)
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
## 2018-12-29 NOTE — Plan of Care (Signed)
  Problem: Education: Goal: Knowledge of General Education information will improve Description: Including pain rating scale, medication(s)/side effects and non-pharmacologic comfort measures Outcome: Progressing   Problem: Clinical Measurements: Goal: Ability to maintain clinical measurements within normal limits will improve Outcome: Progressing   Problem: Activity: Goal: Risk for activity intolerance will decrease Outcome: Progressing   Problem: Pain Managment: Goal: General experience of comfort will improve Outcome: Progressing   Problem: Skin Integrity: Goal: Risk for impaired skin integrity will decrease Outcome: Progressing   

## 2018-12-30 LAB — CBC
HCT: 24.9 % — ABNORMAL LOW (ref 36.0–46.0)
Hemoglobin: 8.1 g/dL — ABNORMAL LOW (ref 12.0–15.0)
MCH: 29.2 pg (ref 26.0–34.0)
MCHC: 32.5 g/dL (ref 30.0–36.0)
MCV: 89.9 fL (ref 80.0–100.0)
Platelets: 408 10*3/uL — ABNORMAL HIGH (ref 150–400)
RBC: 2.77 MIL/uL — ABNORMAL LOW (ref 3.87–5.11)
RDW: 14.8 % (ref 11.5–15.5)
WBC: 8.9 10*3/uL (ref 4.0–10.5)
nRBC: 0 % (ref 0.0–0.2)

## 2018-12-30 LAB — GLUCOSE, CAPILLARY: Glucose-Capillary: 86 mg/dL (ref 70–99)

## 2018-12-30 MED ORDER — TAMSULOSIN HCL 0.4 MG PO CAPS: 0.4000 mg | ORAL_CAPSULE | Freq: Every day | ORAL | 0 refills | Status: AC

## 2018-12-30 MED ORDER — ONDANSETRON HCL 4 MG PO TABS: 4.0000 mg | ORAL_TABLET | Freq: Four times a day (QID) | ORAL | 0 refills | Status: AC | PRN

## 2018-12-30 MED ORDER — OXYCODONE HCL 5 MG PO TABS: 5.0000 mg | ORAL_TABLET | Freq: Four times a day (QID) | ORAL | 0 refills | Status: AC | PRN

## 2018-12-30 MED ORDER — CEFUROXIME AXETIL 500 MG PO TABS: 500.0000 mg | ORAL_TABLET | Freq: Two times a day (BID) | ORAL | 0 refills | Status: AC

## 2018-12-30 MED ORDER — ACETAMINOPHEN 325 MG PO TABS: 650.0000 mg | ORAL_TABLET | Freq: Four times a day (QID) | ORAL | Status: AC

## 2018-12-30 MED ORDER — ASPIRIN 81 MG PO CHEW: 81.0000 mg | CHEWABLE_TABLET | Freq: Every day | ORAL | Status: AC

## 2018-12-30 MED ORDER — LIDOCAINE 5 % EX PTCH: 1.0000 | MEDICATED_PATCH | CUTANEOUS | 0 refills | Status: AC

## 2018-12-30 MED ORDER — METHOCARBAMOL 500 MG PO TABS: 500.0000 mg | ORAL_TABLET | Freq: Three times a day (TID) | ORAL | Status: AC | PRN

## 2018-12-30 MED ORDER — PANTOPRAZOLE SODIUM 40 MG PO TBEC: 40.0000 mg | DELAYED_RELEASE_TABLET | Freq: Two times a day (BID) | ORAL | 0 refills | Status: AC

## 2018-12-30 NOTE — TOC Transition Note (Signed)
Transition of Care [facility]) - CM/SW Discharge Note   Patient Details  Name: Patricia Malone MRN: [id number] Date of Birth: Dec 12, 1940  [facility] CM/SW Contact:  Alberteen Sam, LCSW Phone Number: 12/30/2018, 11:05 AM   Clinical Narrative:     Patient will DC to: [name] Anticipated DC date: 12/30/2018 Family notified: [name] Transport by: Corey Harold  Per MD [facility] . RN, patient, patient's family, and facility notified of DC. Discharge Summary sent to facility. RN given number for report [phone] . DC packet on chart. Ambulance transport requested for patient.  CSW signing off.  [place], [place]   Final next level of care: Skilled Nursing Facility Barriers to Discharge: No Barriers Identified   Patient Goals and CMS Choice Patient states their goals for this hospitalization and ongoing recovery are:: to go [facility] home CMS Medicare.gov Compare Post Acute Care list provided to:: Patient Represenative (must comment)([name] Choice offered to / list presented to : Adult Children([name]  Discharge Placement PASRR number recieved: 12/27/18            Patient chooses bed at: [name] Patient to be transferred to facility by: [facility] Name of family member notified: [name] (son) Patient and family notified of of transfer: 12/30/18  Discharge Plan and Services                                     Social Determinants of Health (SDOH) Interventions     Readmission Risk Interventions No flowsheet data found.
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
## 2018-12-30 NOTE — Progress Notes (Signed)
Inpatient Rehab Admissions Coordinator:   Reconsulted for CIR placement. Note pt continues to demo poor tolerance and therapy still recommending SNF with apparent plan to d/c today.  Will sign off.   Shann Medal, PT, DPT Admissions Coordinator [phone] 12/30/18  10:15 AM

## 2018-12-30 NOTE — Progress Notes (Signed)
Report called to Hayden at [facility], all questions answered. Pt belongings gathered to be sent with pt, this includes 2 hearing aids and cell phone. Pt's son Harrell Gave updated on pt discharging today.

## 2018-12-30 NOTE — Progress Notes (Signed)
Neurosurgery Service Progress Note  Subjective: No acute events overnight, back feels better, no new complaints  Objective: Vitals:   12/29/18 1518 12/29/18 1927 12/30/18 0317 12/30/18 0757  BP: 140/67 (!) 159/143 130/60 (!) 146/63  Pulse: 77 82 87 80  Resp: 18 16  16   Temp: 98.6 F (37 C) 97.8 F (36.6 C) 98.3 F (36.8 C) 98.2 F (36.8 C)  TempSrc: Oral Oral Oral Oral  SpO2: 100% 100% 100% 99%  Weight:      Height:       Temp (24hrs), Avg:98.2 F (36.8 C), Min:97.8 F (36.6 C), Max:98.6 F (37 C)  CBC Latest Ref Rng & Units 12/29/2018 12/28/2018 12/27/2018  WBC 4.0 - 10.5 K/uL 7.7 8.6 -  Hemoglobin 12.0 - 15.0 g/dL 7.9(L) 7.4(L) 7.5(L)  Hematocrit 36.0 - 46.0 % 24.1(L) 22.8(L) 22.8(L)  Platelets 150 - 400 K/uL 333 342 -   BMP Latest Ref Rng & Units 12/27/2018 12/25/2018 12/24/2018  Glucose 70 - 99 mg/dL 67(L) 101(H) 131(H)  BUN 8 - 23 mg/dL 27(H) 17 37(H)  Creatinine 0.44 - 1.00 mg/dL 0.85 0.68 0.87  Sodium 135 - 145 mmol/L 138 140 138  Potassium 3.5 - 5.1 mmol/L 3.7 3.6 3.5  Chloride 98 - 111 mmol/L 112(H) 110 105  CO2 22 - 32 mmol/L 15(L) 22 22  Calcium 8.9 - 10.3 mg/dL 7.9(L) 8.3(L) 8.3(L)    Intake/Output Summary (Last 24 hours) at 12/30/2018 0906 Last data filed at 12/30/2018 0700 Gross per 24 hour  Intake 580 ml  Output 1425 ml  Net -845 ml    Current Facility-Administered Medications:  .  acetaminophen (TYLENOL) tablet 650 mg, 650 mg, Oral, Q6H, Arta Silence, MD, 650 mg at 12/30/18 0206 .  cefTRIAXone (ROCEPHIN) 1 g in sodium chloride 0.9 % 100 mL IVPB, 1 g, Intravenous, Daily, Arta Silence, MD, Last Rate: 200 mL/hr at 12/29/18 0908, 1 g at 12/29/18 0908 .  lidocaine (LIDODERM) 5 % 1 patch, 1 patch, Transdermal, Q24H, Arta Silence, MD, 1 patch at 12/29/18 1641 .  methocarbamol (ROBAXIN) tablet 500 mg, 500 mg, Oral, Q8H PRN, Arta Silence, MD, 500 mg at 12/30/18 0513 .  metoprolol tartrate (LOPRESSOR) tablet 25 mg, 25 mg, Oral, Daily, Arta Silence,  MD, 25 mg at 12/29/18 0906 .  morphine 2 MG/ML injection 2 mg, 2 mg, Intravenous, Q6H PRN, Arta Silence, MD, 2 mg at 12/29/18 1645 .  ondansetron (ZOFRAN) tablet 4 mg, 4 mg, Oral, Q6H PRN, 4 mg at 12/19/18 0616 **OR** ondansetron (ZOFRAN) injection 4 mg, 4 mg, Intravenous, Q6H PRN, Arta Silence, MD, 4 mg at 12/29/18 1558 .  oxyCODONE (Oxy IR/ROXICODONE) immediate release tablet 5 mg, 5 mg, Oral, Q6H PRN, 5 mg at 12/30/18 0513 **OR** [DISCONTINUED] oxyCODONE (Oxy IR/ROXICODONE) immediate release tablet 10 mg, 10 mg, Oral, Q4H PRN, Elodia Florence., MD, 10 mg at 12/26/18 1230 .  pantoprazole (PROTONIX) injection 40 mg, 40 mg, Intravenous, Q12H, Arta Silence, MD, 40 mg at 12/29/18 2148 .  phenol (CHLORASEPTIC) mouth spray 1 spray, 1 spray, Mouth/Throat, PRN, Arta Silence, MD, 1 spray at 12/26/18 0841 .  pravastatin (PRAVACHOL) tablet 40 mg, 40 mg, Oral, q1800, Arta Silence, MD, 40 mg at 12/29/18 1647 .  tamsulosin (FLOMAX) capsule 0.4 mg, 0.4 mg, Oral, QPC supper, Arta Silence, MD, 0.4 mg at 12/29/18 1647   Physical Exam: AOx3, PERRL, EOMI, FS, Strength 5/5 x4, SILTx4  Assessment & Plan: 78 y.o. woman w/ L1 burst frx s/p T12-L2 percutaneous pedicle screw fixation, c/c/b melena.  -  no change in neurosurgical plan of care, pt states she is supposed to go to SNF today, will place follow up and wound care instructions in her discharge instructions in Epic  Patricia Malone  12/30/18 9:06 AM
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
## 2020-10-30 IMAGING — RF THORACOLUMBAR SPINE - 2 VIEW
1 series · 4 of 4 positions shown · non-contrast
Comparison: MRI 12/19/2018.  CT's 12/18/2018.

CLINICAL DATA: Thoracolumbar fusion.

EXAM:
DG C-ARM 61-120 MIN; THORACOLUMBAR SPINE - 2 VIEW

[Series 1: run · 4 of 4 slices shown]
[im 1/4]
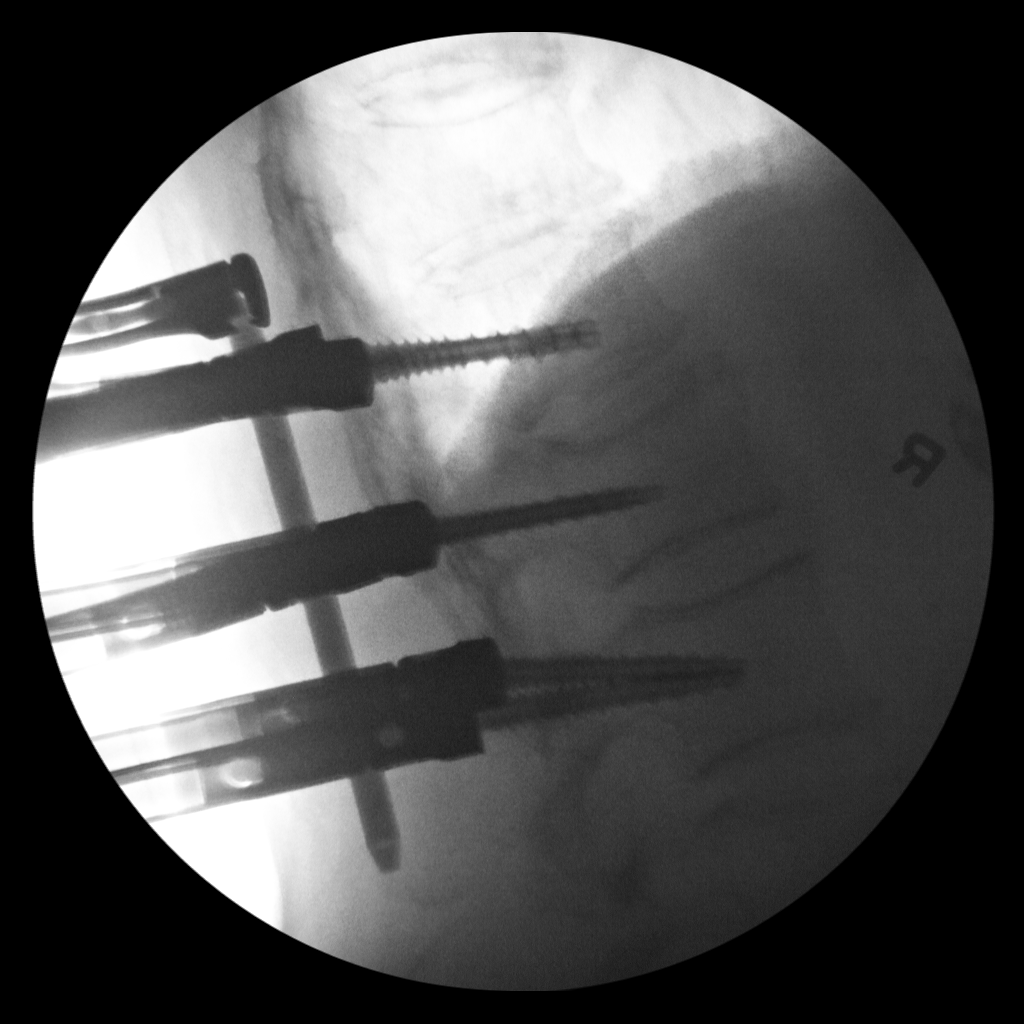
[im 2/4]
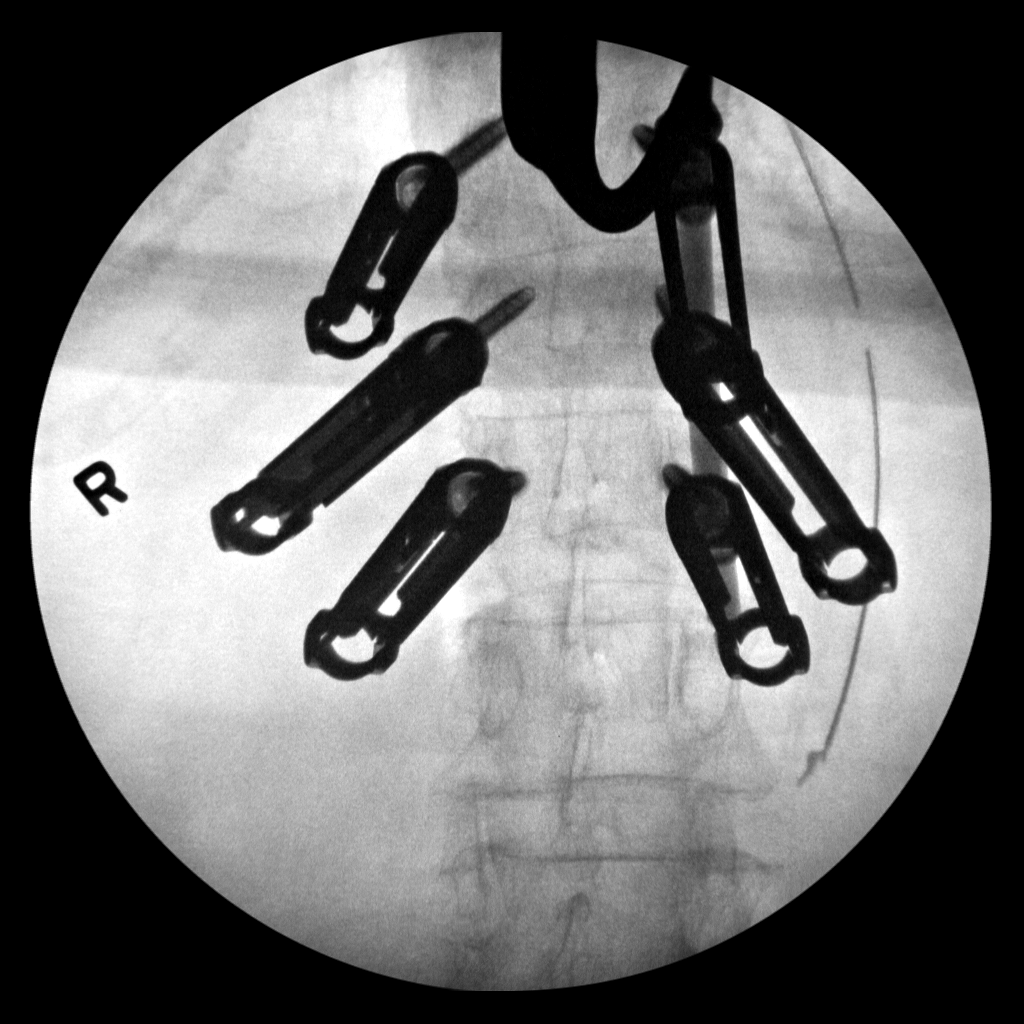
[im 3/4]
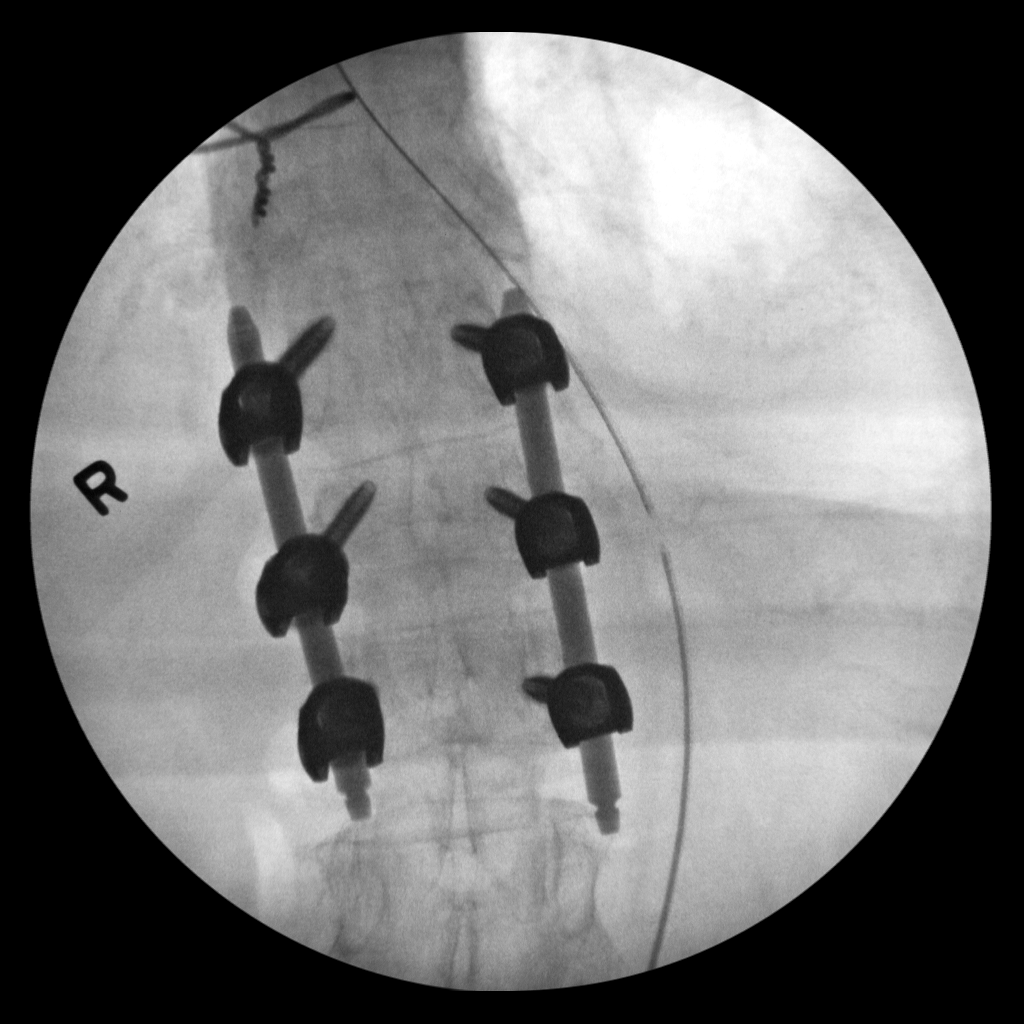
[im 4/4]
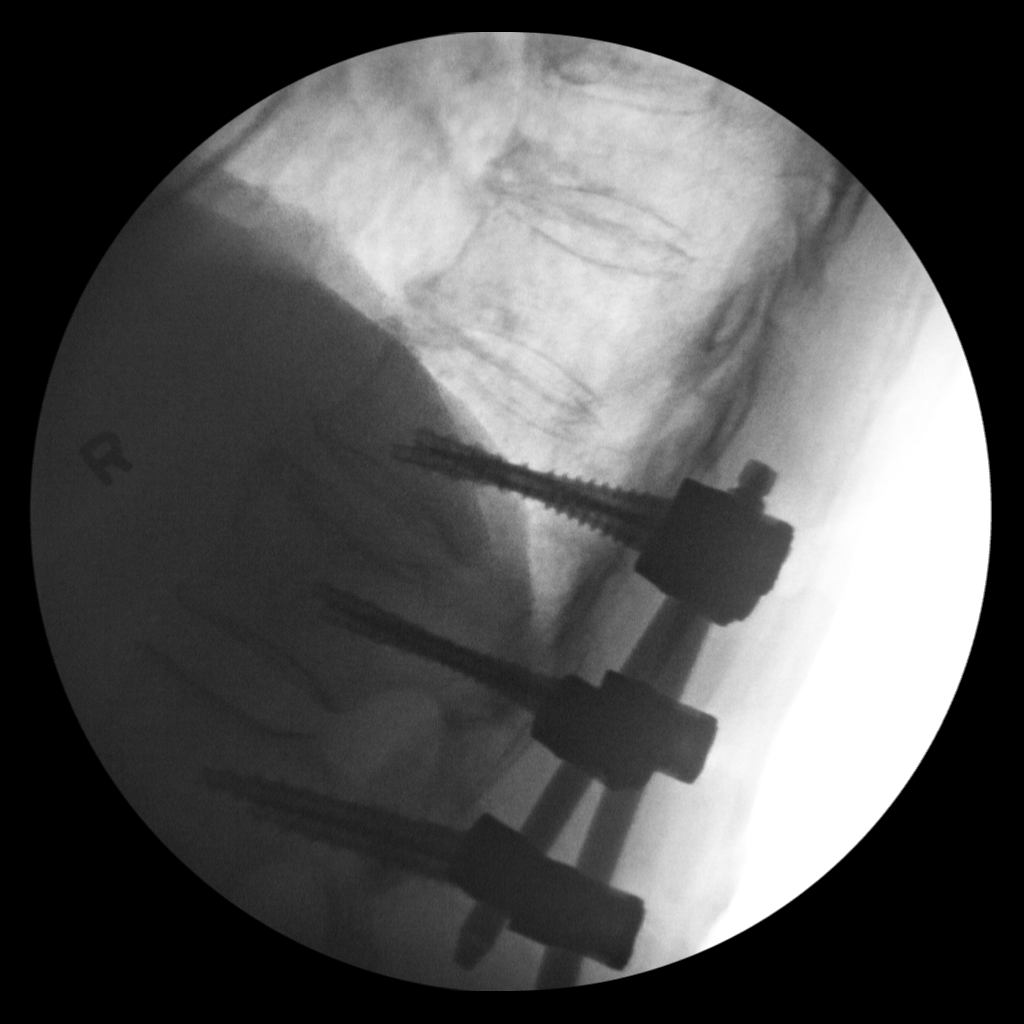

[4 of 4 positions shown; findings below may reference images not displayed]

FINDINGS: Thoracolumbar spine numbered with the lowest ribbed vertebrae as
T12. Thoracolumbar fusion which appears to be at the level of T12
through L2 noted. Hardware intact. Anatomic alignment. No acute bony
abnormality. NG tube noted 4 images obtained. 3 minutes 45 seconds
fluoroscopy time utilized.
IMPRESSION: T12-L2 posterior fusion.  Hardware intact.  Anatomic alignment.
